# Patient Record
Sex: Female | Born: 1965
Health system: Southern US, Community
[De-identification: ages and names within clinical notes are randomized; demographics above are authoritative.]

## PROBLEM LIST (undated history)

## (undated) DIAGNOSIS — Z9889 Other specified postprocedural states: Secondary | ICD-10-CM

## (undated) DIAGNOSIS — K589 Irritable bowel syndrome without diarrhea: Secondary | ICD-10-CM

## (undated) DIAGNOSIS — E039 Hypothyroidism, unspecified: Secondary | ICD-10-CM

## (undated) DIAGNOSIS — K219 Gastro-esophageal reflux disease without esophagitis: Secondary | ICD-10-CM

## (undated) HISTORY — DX: Other specified postprocedural states: Z98.890

## (undated) HISTORY — PX: KNEE SURGERY: SHX244

## (undated) HISTORY — DX: Hypothyroidism, unspecified: E03.9

## (undated) HISTORY — PX: CHOLECYSTECTOMY: SHX55

## (undated) HISTORY — DX: Gastro-esophageal reflux disease without esophagitis: K21.9

---

## 1998-05-19 ENCOUNTER — Other Ambulatory Visit: Admission: RE | Admit: 1998-05-19 | Discharge: 1998-05-19 | Payer: Self-pay | Admitting: Obstetrics & Gynecology

## 1999-05-27 ENCOUNTER — Other Ambulatory Visit: Admission: RE | Admit: 1999-05-27 | Discharge: 1999-05-27 | Payer: Self-pay | Admitting: Obstetrics & Gynecology

## 2000-07-07 ENCOUNTER — Other Ambulatory Visit: Admission: RE | Admit: 2000-07-07 | Discharge: 2000-07-07 | Payer: Self-pay | Admitting: Obstetrics & Gynecology

## 2001-03-15 ENCOUNTER — Ambulatory Visit (HOSPITAL_COMMUNITY): Admission: RE | Admit: 2001-03-15 | Discharge: 2001-03-15 | Payer: Self-pay | Admitting: Obstetrics & Gynecology

## 2001-08-10 ENCOUNTER — Other Ambulatory Visit: Admission: RE | Admit: 2001-08-10 | Discharge: 2001-08-10 | Payer: Self-pay | Admitting: Obstetrics & Gynecology

## 2001-12-08 ENCOUNTER — Ambulatory Visit (HOSPITAL_COMMUNITY): Admission: RE | Admit: 2001-12-08 | Discharge: 2001-12-08 | Payer: Self-pay | Admitting: Family Medicine

## 2001-12-08 ENCOUNTER — Encounter: Payer: Self-pay | Admitting: Family Medicine

## 2001-12-25 ENCOUNTER — Observation Stay (HOSPITAL_COMMUNITY): Admission: RE | Admit: 2001-12-25 | Discharge: 2001-12-26 | Payer: Self-pay | Admitting: General Surgery

## 2001-12-25 ENCOUNTER — Encounter: Payer: Self-pay | Admitting: General Surgery

## 2001-12-25 ENCOUNTER — Encounter (INDEPENDENT_AMBULATORY_CARE_PROVIDER_SITE_OTHER): Payer: Self-pay | Admitting: Specialist

## 2002-05-21 ENCOUNTER — Other Ambulatory Visit: Admission: RE | Admit: 2002-05-21 | Discharge: 2002-05-21 | Payer: Self-pay | Admitting: Obstetrics & Gynecology

## 2002-09-12 ENCOUNTER — Ambulatory Visit (HOSPITAL_COMMUNITY): Admission: AD | Admit: 2002-09-12 | Discharge: 2002-09-12 | Payer: Self-pay | Admitting: Obstetrics & Gynecology

## 2002-12-05 ENCOUNTER — Inpatient Hospital Stay (HOSPITAL_COMMUNITY): Admission: AD | Admit: 2002-12-05 | Discharge: 2002-12-05 | Payer: Self-pay | Admitting: Obstetrics and Gynecology

## 2002-12-07 ENCOUNTER — Inpatient Hospital Stay (HOSPITAL_COMMUNITY): Admission: AD | Admit: 2002-12-07 | Discharge: 2002-12-12 | Payer: Self-pay | Admitting: Obstetrics and Gynecology

## 2002-12-09 ENCOUNTER — Encounter (INDEPENDENT_AMBULATORY_CARE_PROVIDER_SITE_OTHER): Payer: Self-pay | Admitting: *Deleted

## 2003-01-30 ENCOUNTER — Other Ambulatory Visit: Admission: RE | Admit: 2003-01-30 | Discharge: 2003-01-30 | Payer: Self-pay | Admitting: Obstetrics & Gynecology

## 2004-02-20 ENCOUNTER — Other Ambulatory Visit: Admission: RE | Admit: 2004-02-20 | Discharge: 2004-02-20 | Payer: Self-pay | Admitting: Obstetrics & Gynecology

## 2005-05-24 ENCOUNTER — Other Ambulatory Visit: Admission: RE | Admit: 2005-05-24 | Discharge: 2005-05-24 | Payer: Self-pay | Admitting: Obstetrics & Gynecology

## 2006-11-14 ENCOUNTER — Ambulatory Visit (HOSPITAL_COMMUNITY): Admission: RE | Admit: 2006-11-14 | Discharge: 2006-11-14 | Payer: Self-pay | Admitting: Family Medicine

## 2007-01-30 ENCOUNTER — Inpatient Hospital Stay (HOSPITAL_COMMUNITY): Admission: AD | Admit: 2007-01-30 | Discharge: 2007-01-30 | Payer: Self-pay | Admitting: Obstetrics and Gynecology

## 2007-11-16 ENCOUNTER — Encounter (HOSPITAL_COMMUNITY): Admission: RE | Admit: 2007-11-16 | Discharge: 2007-12-13 | Payer: Self-pay | Admitting: Family Medicine

## 2007-12-22 ENCOUNTER — Inpatient Hospital Stay (HOSPITAL_COMMUNITY): Admission: AD | Admit: 2007-12-22 | Discharge: 2007-12-22 | Payer: Self-pay | Admitting: Obstetrics and Gynecology

## 2008-04-16 ENCOUNTER — Inpatient Hospital Stay (HOSPITAL_COMMUNITY): Admission: RE | Admit: 2008-04-16 | Discharge: 2008-04-19 | Payer: Self-pay | Admitting: Obstetrics and Gynecology

## 2008-04-16 ENCOUNTER — Encounter (INDEPENDENT_AMBULATORY_CARE_PROVIDER_SITE_OTHER): Payer: Self-pay | Admitting: Obstetrics and Gynecology

## 2010-07-13 DIAGNOSIS — Z9889 Other specified postprocedural states: Secondary | ICD-10-CM

## 2010-07-13 HISTORY — DX: Other specified postprocedural states: Z98.890

## 2011-02-01 ENCOUNTER — Emergency Department (HOSPITAL_COMMUNITY)
Admission: EM | Admit: 2011-02-01 | Discharge: 2011-02-01 | Disposition: A | Discharge status: Home / Self Care | Payer: BC Managed Care – PPO | Attending: Emergency Medicine | Admitting: Emergency Medicine

## 2011-02-01 DIAGNOSIS — I10 Essential (primary) hypertension: Secondary | ICD-10-CM | POA: Insufficient documentation

## 2011-02-01 DIAGNOSIS — R112 Nausea with vomiting, unspecified: Secondary | ICD-10-CM | POA: Insufficient documentation

## 2011-02-01 DIAGNOSIS — Z79899 Other long term (current) drug therapy: Secondary | ICD-10-CM | POA: Insufficient documentation

## 2011-02-01 LAB — GLUCOSE, CAPILLARY: Glucose-Capillary: 104 mg/dL — ABNORMAL HIGH (ref 70–99)

## 2011-02-01 LAB — URINE MICROSCOPIC-ADD ON

## 2011-02-01 LAB — CBC
HCT: 35.2 % — ABNORMAL LOW (ref 36.0–46.0)
Hemoglobin: 11.7 g/dL — ABNORMAL LOW (ref 12.0–15.0)
MCH: 25.7 pg — ABNORMAL LOW (ref 26.0–34.0)
RBC: 4.55 MIL/uL (ref 3.87–5.11)

## 2011-02-01 LAB — URINALYSIS, ROUTINE W REFLEX MICROSCOPIC
Bilirubin Urine: NEGATIVE
Protein, ur: NEGATIVE mg/dL
Urine Glucose, Fasting: NEGATIVE mg/dL
Urobilinogen, UA: 0.2 mg/dL (ref 0.0–1.0)

## 2011-02-01 LAB — DIFFERENTIAL
Basophils Absolute: 0 10*3/uL (ref 0.0–0.1)
Lymphocytes Relative: 11 % — ABNORMAL LOW (ref 12–46)
Lymphs Abs: 0.8 10*3/uL (ref 0.7–4.0)
Monocytes Absolute: 0.7 10*3/uL (ref 0.1–1.0)
Neutro Abs: 5.8 10*3/uL (ref 1.7–7.7)

## 2011-02-01 LAB — COMPREHENSIVE METABOLIC PANEL
ALT: 25 U/L (ref 0–35)
AST: 23 U/L (ref 0–37)
CO2: 25 mEq/L (ref 19–32)
Chloride: 103 mEq/L (ref 96–112)
GFR calc Af Amer: >60 mL/min (ref >60)
GFR calc non Af Amer: >60 mL/min (ref >60)
Glucose, Bld: 93 mg/dL (ref 70–99)
Sodium: 138 mEq/L (ref 135–145)
Total Bilirubin: 1.3 mg/dL — ABNORMAL HIGH (ref 0.3–1.2)

## 2011-04-08 ENCOUNTER — Ambulatory Visit: Payer: BC Managed Care – PPO | Admitting: Urgent Care

## 2011-04-08 ENCOUNTER — Encounter: Payer: Self-pay | Admitting: Gastroenterology

## 2011-04-08 ENCOUNTER — Ambulatory Visit (INDEPENDENT_AMBULATORY_CARE_PROVIDER_SITE_OTHER): Payer: BC Managed Care – PPO | Admitting: Gastroenterology

## 2011-04-08 VITALS — BP 153/89 | HR 84 | Temp 98.1°F | Ht 65.0 in | Wt 200.0 lb

## 2011-04-08 DIAGNOSIS — R1013 Epigastric pain: Secondary | ICD-10-CM | POA: Insufficient documentation

## 2011-04-08 DIAGNOSIS — R110 Nausea: Secondary | ICD-10-CM | POA: Insufficient documentation

## 2011-04-08 MED ORDER — DEXLANSOPRAZOLE 60 MG PO CPDR: 60.0000 mg | DELAYED_RELEASE_CAPSULE | Freq: Every day | ORAL | Status: DC

## 2011-04-08 NOTE — Progress Notes (Signed)
Referring Provider: Harlow Asa, MD Primary Care Physician:  Harlow Asa, MD Primary Gastroenterologist:  Dr. Jena Gauss  Chief Complaint  Patient presents with  . Gastrophageal Reflux    HPI:  [name] is a pleasant 45 year old female who presents today at the request of Dr. Gerda Diss secondary to GERD, nausea, and epigastric pain. Reports onset of nausea in February, was given Omeprazole. Has constant burping, pressure, and intermittent epigastric pain, specifically after eating. Described as "sore". Taking Tums in addition to Omeprazole BID. Has lost approximately 10 lbs, normal appetite. Trying to watch what she eats. Was taking Advil approximately once a week prior to February, secondary to back pain. No BC powders or Goodys. Has stopped NSAIDs since pain/nausea began in February.   Reports heme + in August, underwent colonoscopy in [place]. No reports available currently.    Past Surgical History  Procedure Date  . Cesarean section     X 2  . Cholecystectomy   . Knee surgery     Current Outpatient Prescriptions  Medication Sig Dispense Refill  . levothyroxine (SYNTHROID, LEVOTHROID) 25 MCG tablet Take 1 tablet by mouth daily.      Marland Kitchen omeprazole (PRILOSEC) 20 MG capsule Take 1 tablet by mouth Twice daily.      . sucralfate (CARAFATE) 1 G tablet Take 1 tablet by mouth 4 (four) times daily -  before meals and at bedtime.        Allergies as of 04/08/2011 - Review Complete 04/08/2011  Allergen Reaction Noted  . Erythromycin Hives 04/08/2011    Family History  Problem Relation Age of Onset  . Colon cancer Neg Hx     History   Social History  . Marital Status: Married    Spouse Name: N/A    Number of Children: 2  . Years of Education: N/A   Occupational History  . 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 Smoking status: Never Smoker   . Smokeless tobacco: Never Used  . Alcohol Use: No  . Drug Use: No  . Sexually Active: Yes -- Female partner(s)    Birth Control/ Protection: None     spouse    Review of Systems: Gen: Denies any fever, chills, sweats, anorexia, fatigue, weakness, malaise CV: Denies chest pain, angina, palpitations, syncope, orthopnea, PND, peripheral edema, and claudication. Resp: Denies dyspnea at rest, dyspnea with exercise, cough, sputum, wheezing, coughing up blood, and pleurisy. GI: Denies vomiting blood, jaundice, and fecal incontinence.   Denies dysphagia or odynophagia. GU : Denies urinary burning, blood in urine, urinary frequency, urinary hesitancy, nocturnal urination, and urinary incontinence. MS: Denies joint pain, limitation of movement, and swelling, stiffness, low back pain, extremity pain. Denies muscle weakness, cramps, atrophy.  Derm: Denies rash, itching, dry skin, hives, moles, warts, or unhealing ulcers.  Psych: Denies depression, anxiety, memory loss, suicidal ideation, hallucinations, paranoia, and confusion. Heme: Denies bruising, bleeding, and enlarged lymph nodes.  Physical Exam: BP 153/89  Pulse 84  Temp 98.1 F (36.7 C)  Ht 5\' 5"  (1.651 m)  Wt 200 lb (90.719 kg)  BMI 33.28 kg/m2  LMP 03/18/2011 General:   Alert,  Well-developed, well-nourished, pleasant and cooperative in NAD Head:  Normocephalic and atraumatic. Eyes:  Sclera clear, no icterus.   Conjunctiva pink. Ears:  Normal auditory acuity. Nose:  No deformity, discharge,  or lesions. Mouth:  No deformity or lesions, dentition normal. Neck:  Supple; no masses or thyromegaly. Lungs:  Clear throughout to  auscultation.   No wheezes, crackles, or rhonchi. No acute distress. Heart:  Regular rate and rhythm; no murmurs, clicks, rubs,  or gallops. Abdomen:  Soft, nontender and nondistended. No masses, hepatosplenomegaly or hernias noted. Normal bowel sounds, without guarding, and without rebound.   Msk:  Symmetrical without gross deformities. Normal posture. Extremities:  Without clubbing or edema. Neurologic:  Alert and  oriented  x4;  grossly normal neurologically. Skin:  Intact without significant lesions or rashes. Cervical Nodes:  No significant cervical adenopathy. Psych:  Alert and cooperative. Normal mood and affect.

## 2011-04-08 NOTE — Assessment & Plan Note (Addendum)
45 year old pleasant Caucasian female with hx of epigastric pain and nausea since February, worsened with food, in the setting of prior NSAID use and despite BID omeprazole and prn Tums. Reportedly heme + in August, underwent colonoscopty that was reportedly normal. Differentials include NSAID-induced gastritis/PUD. Will switch PPIs, stop carafate, and proceed with upper endoscopy to assess further.  Stop omeprazole and carafate. Dexilant 60 mg daily, # 15 samples provided, rx provided EGD with Dr. Jena Gauss in the near future; the R/B/A have been discussed with pt in detail. She desires to proceed. Further recommendations to follow.

## 2011-04-08 NOTE — Assessment & Plan Note (Signed)
Likely r/t epigastric pain. See plan under epigastric pain.

## 2011-04-08 NOTE — Patient Instructions (Signed)
We have set you up for an upper endoscopy.  Continue to avoid Aleve, Ibuprofen, Aspirin, BC powders, and Goody powders.  Stop Omeprazole and Carafate. Begin Dexilant 60 mg by mouth daily. #15 samples provided and savings card. Prescription provided as well; make sure you activate savings card prior to filling prescription.

## 2011-04-09 NOTE — Progress Notes (Signed)
Cc to PCP 

## 2011-04-19 ENCOUNTER — Encounter: Payer: BC Managed Care – PPO | Admitting: Internal Medicine

## 2011-04-27 ENCOUNTER — Ambulatory Visit (HOSPITAL_COMMUNITY)
Admission: RE | Admit: 2011-04-27 | Discharge: 2011-04-27 | Disposition: A | Discharge status: Home / Self Care | Payer: BC Managed Care – PPO | Source: Ambulatory Visit | Attending: Internal Medicine | Admitting: Internal Medicine

## 2011-04-27 ENCOUNTER — Encounter: Payer: BC Managed Care – PPO | Admitting: Internal Medicine

## 2011-04-27 DIAGNOSIS — R1013 Epigastric pain: Secondary | ICD-10-CM

## 2011-04-27 DIAGNOSIS — Z6833 Body mass index (BMI) 33.0-33.9, adult: Secondary | ICD-10-CM | POA: Insufficient documentation

## 2011-04-27 DIAGNOSIS — R112 Nausea with vomiting, unspecified: Secondary | ICD-10-CM | POA: Insufficient documentation

## 2011-04-27 DIAGNOSIS — K219 Gastro-esophageal reflux disease without esophagitis: Secondary | ICD-10-CM

## 2011-04-27 NOTE — Op Note (Signed)
NAMEJAVON, [name]                ACCOUNT NO.:  [id number]   MEDICAL RECORD NO.:  [id number]          PATIENT TYPE:  INP   LOCATION:  9101                          FACILITY:  [facility]   PHYSICIAN:  Guy Sandifer. Henderson Cloud, M.D. DATE OF BIRTH:  Oct 17, 1966   DATE OF PROCEDURE:  04/16/2008  DATE OF DISCHARGE:                               OPERATIVE REPORT   PREOPERATIVE DIAGNOSES:  1. Intrauterine pregnancy at 39-0/7 weeks' estimated gestational age.  2. Previous cesarean section, desires repeat.  3. Desires permanent sterilization.   POSTOPERATIVE DIAGNOSES:  1. Intrauterine pregnancy at 39-0/7 weeks' estimated gestational age.  2. Previous cesarean section, desires repeat.  3. Desires permanent sterilization.   PROCEDURE:  Low-transverse cesarean section with bilateral tubal  ligation.   SURGEON:  Guy Sandifer. Henderson Cloud, MD   ANESTHESIA:  Spinal.   ANESTHESIOLOGIST:  Shayne Alken, M.D.   SPECIMENS:  Placenta to pathology.   FINDINGS:  Viable female infant, Apgars of 8 at 1 and 9 at 5 minutes  respectively.  Birth weight 9 pounds 5 ounces.  Arterial cord pH 7.06.   ESTIMATED BLOOD LOSS:  800 mL.   INDICATIONS AND CONSENTS:  This patient is a 45 year old married white  female, G4, P93, with an EDC of Apr 23, 2008.  Prenatal care has been  complicated by a history of previous cesarean section and  hypothyroidism, on replacement.  She presents for repeat cesarean  section and bilateral tubal ligation.  Potential risks and complications  were discussed preoperatively.  Permanency of tubal ligation, alternate  methods to contraception, failure rate, and increased ectopic risk have  also been reviewed preoperatively.  All questions were answered and  consent was signed on the chart.   DESCRIPTION OF PROCEDURE:  The patient was taken to operating room where  she was identified, spinal anesthetic was placed, and she was placed in  the dorsal supine position with 15 degree left lateral wedge.   She was  then prepped.  Foley catheter was placed and bladders drained and she  was draped in a sterile fashion.  Skin incision through a Pfannenstiel  incision and old scars taken out on the way.  Dissection was carried out  in layers to the peritoneum, which was incised and extended superiorly  and inferiorly.  Vesicouterine peritoneum was taken down  cephalolaterally.  Bladder flap was developed and bladder blade was  placed.  Uterus was incised in a low-transverse manner and the uterine  cavity was entered bluntly with a Kelly clamp.  The uterine was incision  was extended cephalolaterally with fingers.  Artificial rupture of  membranes with clear fluid was carried out.  The rectus muscles would  not allow easy delivery of the vertex.  Therefore, a mushroom vacuum  extractor was placed on the vertex and the vertex was elevated without  difficulty.  Oro and nasopharynx were suctioned.  The remainder of the  baby was delivered and cry and tone was noted.  Cord was clamped and cut  and the baby was handed to waiting pediatrics team.  Placenta was  manually delivered and sent to  pathology.  The uterus was clean.  The  uterus was closed in two running locking imbricating layers of 0-  Monocryl suture, which achieved good hemostasis.  Ovaries were normal  bilaterally.  Left fallopian tube was identified from cornua to fimbria,  grasped in its midportion with a Babcock clamp, and knuckle of tube was  doubly ligated with 2 free ties of 0 plain sutures.  Intervening knuckle  of tube was then sharply resected.  Cautery was used to assure complete  hemostasis.  Similar procedure was carried out on the right side.  Anterior peritoneum was then closed in running fashion with 0-Monocryl  suture, which was also used to reapproximate the pyramidalis muscle in  midline.  Anterior rectus fascia was closed in running fashion with 0-  PDS suture and the skin was closed with clips.  All sponge,  instrument,  and needle counts were correct.  The patient was transferred to the  recovery room in stable condition.      Guy Sandifer Henderson Cloud, M.D.  Electronically Signed     JET/MEDQ  D:  04/16/2008  T:  04/17/2008  Job:  [id number]

## 2011-04-30 NOTE — Op Note (Signed)
[facility]  Patient:    [name], [name] Visit Number: [id number] MRN: [id number]          Service Type: SUR Location: 4W [id number] [id number] Attending Physician:  Delsa Bern Proc. Date: 12/25/01 Admit Date:  12/25/2001                             Operative Report  PREOPERATIVE DIAGNOSIS:  Cholelithiasis.  POSTOPERATIVE DIAGNOSIS:  Cholelithiasis.  PROCEDURE PERFORMED:  Laparoscopic cholecystectomy with intraoperative cholangiogram.  SURGEON:  Sharlet Salina T. Hoxworth, M.D.  ASSISTANT:  Donnie Coffin. Samuella Cota, M.D.  ANESTHESIA:  General.  INDICATION:  Reid Nawrot is a 45 year old white female who recently presentated with acute severe epigastric and right upper quadrant abdominal pain.  This resolved over about 48 hours and at that time an ultrasound was obtained showing small stones and sludge in the gallbladder.  The common bile duct was normal size, but LFTs were moderately elevated.  She has continued to have mild discomfort.  The LFTs have now returned normal.  Laparoscopic cholecystectomy with cholangiogram has been recommended and accepted.  The nature of the procedure, its indications and risks of bleeding and infection, and injury was discussed and understood.  She is now brought to the operating room for this procedure.  DESCRIPTION OF PROCEDURE:  The patient was brought to the operating room and placed in the supine position on the operating table and general endotracheal anesthesia was induced.  IV antibiotics were given.  The abdomen was sterilely prepped and draped.  Local anesthesia was used to infiltrate the trocar sites. A previous hypertrophic scar at the umbilicus from laparoscopy was excised and dissection carried down to the midline fascia which was elevated between two Kochers, sharply incised from 1 cm, and the peritoneum entered under direct. A mattress suture of 0 Vicryl was placed and the Hasson trocar was inserted and  pneumoperitoneum established.  Under direct vision, a 10 mm trocar was placed in the subxiphoid area and two 5 mm trocars in the right subcostal margin.  The gallbladder was visualized and was not acutely inflamed.  The fundus was grasped and elevated up off the liver and infundibulum retracted inferolaterally.  Fibrofatty tissue was taken off the neck of the gallbladder and the distal gallbladder was thoroughly dissected as was Calots triangle. The cystic duct gallbladder junction was dissected 360 degrees and the cystic duct dissected free of about 1 cm.  The cystic duct was clipped at the gallbladder junction.  The anterior branch of the cystic artery was seen coursing upon the gallbladder wall and it was divided between two proximal and one distal clip.  Intraoperative cholangiograms were then obtained through the cystic duct which showed good filling of a normal size common bile duct and intrahepatic ducts with free flow into the duodenum and no filling defects. Following this, the cholangiocath was removed and the cystic duct was doubly clipped proximally and divided.  The gallbladder was then dissected free from its bed using hook cautery.  A small posterior cystic artery branch was clipped from the gallbladder bed.  The gallbladder was removed intact through the umbilicus.  The operative site was inspected for hemostasis which was complete.  The trocars were removed under direct vision.  All CO2 was evacuated from the peritoneal cavity.  The mattress suture was secured at the umbilicus.  Skin incisions were closed with interrupted subcuticular 4-0 Monocryl and Steri-Strips.  Sponge and instrument counts were  correct.  Fluff dressings were applied and the patient taken to the recovery room in good condition. Attending Physician:  Delsa Bern DD:  12/25/01 TD:  12/25/01 Job: [id number]

## 2011-04-30 NOTE — Discharge Summary (Signed)
[name], [name]                ACCOUNT NO.:  [id number]   MEDICAL RECORD NO.:  [id number]          PATIENT TYPE:  INP   LOCATION:  9101                          [facility]ILITY:  [facility]   PHYSICIAN:  Michelle L. Grewal, M.D.DATE OF BIRTH:  [birth year]   DATE OF ADMISSION:  04/16/2008  DATE OF DISCHARGE:  04/19/2008                               DISCHARGE SUMMARY   ADMITTING DIAGNOSES:  1. Intrauterine pregnancy at 80 and 0/7th weeks' estimated gestational      age.  2. Previous cesarean section, desires repeat.  3. Multiparity, desires permanent sterilization.   DISCHARGE DIAGNOSES:  1. Status post low transverse cesarean section.  2. Viable female infant.  3. Permanent sterilization.   PROCEDURE:  1. Repeat low transverse cesarean section.  2. Bilateral tubal ligation.   REASON FOR ADMISSION:  Please see written H and P.   HOSPITAL COURSE:  The patient is a 45 year old white married female  gravida 4, para 1 who presents to [facility] for  scheduled cesarean section.  Due to multiparity, the patient also  requested permanent sterilization.  On the morning of admission, the  patient was taken to the operating room where spinal anesthesia was  administered without difficulty.  A low transverse incision was made  with delivery of a viable female infant weighing 9 pounds 5 ounces with  Apgars of 8 at 1 minute and 9 at 5 minutes.  Arterial cord pH was 7.06.  The patient tolerated the procedure well and was taken to the recovery  room in stable condition.  On postoperative day #1, the patient denied  headache, blurred vision, or right upper quadrant pain.  Vital signs  were stable.  She is afebrile.  Abdomen soft.  Blood pressure was noted  to be 142/87 to 135/82.  Abdominal dressing was clean, dry, and intact.  Foley had been discontinued, voiding well.  Laboratory findings revealed  hemoglobin of 9.4, platelet count 157,000, and WBC count of 9.9.  AST  was 26, ALT was  14.  Uric acid was pending.  By postoperative day #2,  the patient continued to deny headache, blurred vision, or right upper  quadrant pian.  She did complain of some lower extremity edema, left  greater than right.  Vital signs were stable with blood pressure 140/83  to 144/88.  Abdomen soft.  Good return of bowel function.  Fundus firm  and nontender.  Incision was clean, dry, and intact.  Small lower  extremity edema was noted.  Deep tendon reflexes 1 to 2+, no clonus, no  erythema, and negative Homan's.  PIH panel was drawn, and the patient  was offered hydrochlorothiazide, however, she declined. On postoperative  day #3, the patient denied headache, blurred vision, or right upper  quadrant pain.  Vital signs remained stable.  Blood pressure was 138/88  to 159/94.  Deep tendon reflexes 2+.  Pedal edema was noted.  Abdomen  soft.  Fundus firm and nontender.  Incision was clean, dry, and intact.  Staples were removed.  Laboratory findings showed hemoglobin of 8.8,  platelet count of 189,000, and  WBC count of 8.6.  AST was 35, ALT was  19, and alkaline phosphatase was 120.  Discharge instructions were  reviewed, and the patient was later discharged home.   CONDITION ON DISCHARGE:  Stable.   DIET:  Regular as tolerated.   ACTIVITY:  No heavy lifting, no driving x2 weeks.  No vaginal entry.   FOLLOWUP:  The patient is to follow up in the office in 3-4 days for  blood pressure check and incision check.  She is to call for temperature  greater than 100 degrees, persistent nausea, vomiting, heavy vaginal  bleeding, and/or redness or drainage in the incisional site.   DISCHARGE MEDICATIONS:  1. Percocet 5/325, #30, one p.o. q.4-6 h. p.r.n.  2. Motrin 600 mg every 6 hours p.r.n.  3. Levothyroxine one p.o. daily.  4. Prenatal vitamins one p.o. daily.      [name], N.P.      [name]. [name], M.D.  Electronically Signed    CC/MEDQ  D:  05/03/2008  T:  05/04/2008  Job:   [id number]

## 2011-04-30 NOTE — Discharge Summary (Signed)
NAME:  [name], [name]                          ACCOUNT NO.:  [id number]   MEDICAL RECORD NO.:  [id number]                   PATIENT TYPE:  INP   LOCATION:  9101                                 [facility]ILITY:  [facility]   PHYSICIAN:  Freddy Finner, M.D.                DATE OF BIRTH:  September 23, 1966   DATE OF ADMISSION:  12/07/2002  DATE OF DISCHARGE:  12/12/2002                                 DISCHARGE SUMMARY   ADMITTING DIAGNOSES:  1. Intrauterine pregnancy at 43 and 3/7 weeks estimated gestational age.  2. Pregnancy induced hypertension.   DISCHARGE DIAGNOSES:  1. Status post low transverse cesarean section secondary to nonreassuring     nonstress test.  2. Viable female infant.   PROCEDURE:  Primary low transverse cesarean section.   REASON FOR ADMISSION:  Please see written H&P.   HOSPITAL COURSE:  The patient is a 45 year old gravida 2, para 0 at 58 and  3/7 weeks estimated gestational age who was admitted to [facility] for a two-stage induction of labor due to pregnancy induced  hypertension.  Laboratories were within normal limits and blood pressure was  stable.  Cytotec was administered on the evening of admission with Pitocin  started the following morning.  This failed to achieve active labor.  Pitocin was discontinued.  Cytotec was reapplied that evening and Pitocin  was started on the following a.m.  The patient did progress to 4 cm dilated,  100% effaced with vertex noted to be a -3 station with some molding.  Spontaneous rupture of membranes did occur which revealed meconium stained  fluid.  Intrauterine pressure catheter and fetal scalp electrode were  placed.  Amnioinfusion was started secondary to meconium stained fluid.  Fetal heart tones were noted to have decreased beat to beat variability.  There was also noted some late decelerations on the fetal monitor strip.  Cervix remained unchanged at 4 cm dilated, now 90% effaced with vertex that  continued  to be at a -3 station.  Due to lack of reassurance with fetal  heart tone tracings, decision was made to proceed with the cesarean  delivery.  The patient was taken to the operating room where epidural  anesthesia was augmented to a surgical level.  A low transverse incision was  made with the delivery of a viable female infant weighing 7 pounds 15 ounces  with Apgars of 8 at one minute, 9 at five minutes.  Arterial cord pH was  7.24.  The patient tolerated procedure well and was taken to the recovery  room in stable condition.   On postoperative day one patient had good return of bowel function.  Abdomen  was soft.  Fundus was firm and nontender.  Abdominal dressing was clean,  dry, and intact.  Laboratories revealed hemoglobin of 11.0, platelet count  of 196,000, WBC count of 11.5.  On postoperative day two patient was  ambulating well.  Blood pressure was stable.  Abdominal dressing was removed  revealing an incision that was clean, dry, and intact.  The patient was  tolerating a regular diet without complaints of nausea and vomiting.  On  postoperative day three patient was doing well.  Incision was clean, dry,  and intact.  Staples were removed and patient was discharged home.   CONDITION ON DISCHARGE:  Good.   DIET:  Regular as tolerated.   ACTIVITY:  No heavy lifting.  No driving x2 weeks.  No vaginal entry.   FOLLOW UP:  The patient is to follow up in the office in one to two weeks  for an incision check.  She is to call for temperature greater than 100  degrees, persistent nausea and vomiting, heavy vaginal bleeding, and/or  redness or drainage from the incisional site.   DISCHARGE MEDICATIONS:  1. Percocet 5/325 numbers 30 one p.o. q.4-6h. p.r.n. pain.  2. Motrin 600 mg q.6h. p.r.n.  3. Prenatal vitamins one p.o. daily.  4. Colace one p.o. daily p.r.n.     [name], N.P.                        Freddy Finner, M.D.    CC/MEDQ  D:  01/15/2003  T:  01/15/2003  Job:   [id number]

## 2011-04-30 NOTE — Op Note (Signed)
[facility]  Patient:    [name], [name]                         MRN: [id number] Proc. Date: 03/15/01 Attending:  Freddy Finner, M.D.                           Operative Report  PREOPERATIVE DIAGNOSIS:       Chronic left pelvic pain, suspected                               pelvic endometriosis.  POSTOPERATIVE DIAGNOSES:      Pelvic endometriosis.                               Adhesions of sigmoid colon to left                               lateral pelvic peritoneum.                               Endometriotic lesions of left uterosacral                               and cul-de-sac; uterosacral lesions of                               abdominal peritoneum near adhesions to                               appendix and to ascending colon and cecum.  OPERATION:                    Diagnostic laparoscopy, bipolar ablation                               of all visible evidence of pelvic endometriosis,                               lysis of perisigmoid and periappendiceal                               adhesions with fulguration along the line of                               insertion of the adhesions in the left pelvis                               due to suspicion of endometriosis as the                               etiology of these adhesions.  SURGEON:  Freddy Finner, M.D.  INTRAOPERATIVE COMPLICATIONS: None.  ANESTHESIA:                   General endotracheal anesthesia.  ESTIMATED BLOOD LOSS:         Less than 10 cc.  INDICATIONS:                  The patient is a 45 year old gravida 1, para 0 who has had several months history of chronic left sided pelvic pain. Due to the persistence of her symptoms and the discomfort associated with the anxiety related to this, she has elected to proceed with diagnostic laparoscopy. She is admitted at this time for that purpose.  DESCRIPTION OF PROCEDURE:     She was admitted on the morning  of surgery, brought to the operating room and there placed under adequate general endotracheal anesthesia, and placed in the dorsal lithotomy position using the Allen stirrups system. Betadine prep of the abdomen, perineum and vagina was carried out in the usual fashion. The bladder was evacuated with a Robinson catheter. A Hulka tenaculum was attached to the cervix without difficulty. Sterile drapes were applied.  Two small incisions were made in the abdomen, one at the umbilicus and one just above the symphysis. An 11 mm disposable trocar was introduced through the upper incision; adequate placement was confirmed by visual microscopic exam. Pneumoperitoneum was then allowed to accumulate using carbon dioxide gas. A second 5 mm trocar was placed through the lower incision. A blunt probe was initially used and then the Nezhat irrigation probe through the lower incision. Using the bipolar Kleppinger forceps and reticulated scissors, all visible evidence of endometriosis in the pelvis and lower abdomen was fulgurated and the adhesions around the appendix which tented the appendix to the lateral wall on the right and the ones around the sigmoid were divided and fulgurated. Complete hemostasis was noted on careful inspection after the dissection. Irrigating solution was used during the procedure which was Ringers lactate; this was all aspirated from the abdomen. The gas was allowed to escape from the abdomen. Plain Marcaine 0.50% was injected into the two incision sites for postoperative analgesia. The incision sites were closed with interrupted subcuticular sutures of 3-0 Dexon and Steri-Strips were applied to the lower incision. The patient tolerated the procedure well and was taken to the recovery room in good condition.  DISPOSITION:                  She will be given routine outpatient surgical instructions. She was given Vicodin to be taken as needed for postoperative pain. She is to  follow-up in the office in approximately ten days. DD:  03/15/01 TD:  03/15/01 Job: [id number]

## 2011-04-30 NOTE — Op Note (Signed)
NAME:  MECHELE, KITTLESON                          ACCOUNT NO.:  [id number]   MEDICAL RECORD NO.:  [id number]                   PATIENT TYPE:  INP   LOCATION:  9101                                 FACILITY:  [facility]   PHYSICIAN:  Guy Sandifer. Arleta Creek, M.D.           DATE OF BIRTH:  [birth year]   DATE OF PROCEDURE:  12/09/2002  DATE OF DISCHARGE:                                 OPERATIVE REPORT   PREOPERATIVE DIAGNOSES:  1. Intrauterine pregnancy at 39-3/7 weeks' estimated gestational age.  2. Pregnancy induced hypertension.  3. Nonreassuring fetal heart tracing.   POSTOPERATIVE DIAGNOSES:  1. Intrauterine pregnancy at 39-3/7 weeks' estimated gestational age.  2. Pregnancy induced hypertension.  3. Nonreassuring fetal heart tracing.   PROCEDURE:  Low transverse cesarean section.   SURGEON:  Guy Sandifer. Henderson Cloud, M.D.   ANESTHESIA:  Epidural by Raul Del, M.D.   ESTIMATED BLOOD LOSS:  800 cc.   SPECIMENS:  Placenta.   FINDINGS:  Viable female infant, Apgars of 8 and 9 at one and five minutes,  respectively.  Arterial cord pH 7.24.  Birth weight pending.   INDICATIONS AND CONSENT:  This patient is a 45 year old married white  female, G2, P0, with an EDC of December 13, 2002.  She was admitted to [facility] on December 07, 2002, for a two stage induction for pregnancy  induced hypertension.  Laboratories remained normal, and blood pressures  remained stable.  She underwent Cytotec followed by Pitocin on December 08, 2002.  This failed to achieve active phase of labor.  The Pitocin was  discontinued.  Cytotec was repeated on the evening of December 08, 2002, and  Pitocin was resumed on December 09, 2002.  The patient progressed to 4 cm  dilation, complete effacement, -3 station with some molding.  Spontaneous  rupture of membranes revealed meconium staining.  At approximately 1 p.m.  intrauterine pressure catheter and fetal scalp electrode were placed.  The  patient was  placed on oxygen for decreased irritability and amnioinfusion  was started secondary to meconium staining.  Following this, the fetal heart  tracing remained flat with very little beat-to-beat variability.  There were  also some late declarations noted on the tracing.  The cervix remained  unchanged at 4 cm dilation, 90% effacement and -3 station with molding.  Due  to the lack of reassurance in the fetal heart tracing and anticipated long  length of the labor as well as the high station cesarean section is  recommended.  The potential risks and complications were discussed with the  patient and her husband including, but not limited to, infection, bowel,  bladder or ureteral damage, bleeding requiring transfusion of blood  products, possible transfusion reaction, HIV and hepatitis acquisition, DVT,  PE, pneumonia.  All questions were answered, and consent is signed on the  chart.   DESCRIPTION OF PROCEDURE:  The patient was taken to the  operating room where  epidural anesthetic was augmented to a surgical level.  She was then placed  in the dorsal supine position with a 15 degree left lateral wedge.  Foley  catheter was already in place.  She was prepped and draped in a sterile  fashion.  After testing for adequate epidural anesthesia, the skin was  entered through a Pfannenstiel incision and dissection was carried out in  layers to the peritoneum.  The peritoneum was sharply incised and extended  superiorly and inferiorly.  The vesicouterine peritoneum was taken down  cepaholaterally.  The bladder flap was develop,  and the bladder blade was  placed.  The uterus was incised in a low transverse  manner, and the uterine  cavity was entered bluntly with a hemostat.  The uterine incision is  extended cephalolaterally with the fingers.  The vertex was noted to be out  of the pelvis in the occipitoposterior presentation.  The vertex was  delivered, and the oropharynx and nasopharynx were  thoroughly suctioned with  DeLee suction which was productive of mostly clear fluid.  The remainder of  the infant was delivered.  Good cry and tone is noted.  The cord is clamped  and cut, and the infant is handed to the awaiting pediatrics team.  The  placenta was manually delivered and sent to pathology.  The uterine cavity  is normal in contour.  The uterine incision is closed in  a running locking  layer of 0 Monocryl suture and a second imbricating layer of 0 Monocryl.  Additional figure-of-eight is placed on the right side of the incision to  obtain good hemostasis.  Tubes and ovaries palpate normally.  Copious  irrigation is carried out, and all returns are clear.  The anterior  peritoneum is closed in a running fashion with 0 Monocryl suture which is  also used to reapproximate the pyramidalis  muscle in the midline.  The  anterior rectus fascia was closed in a running fashion with 0 PDS suture,  and the skin was closed with clips.  All sponge, instrument and needle  counts were correct, and the patient was transferred to the recovery room in  stable condition.   The pediatrician noticed a 3-5 mm very superficial laceration on the baby's  lip possibly secondary to DeLee suction, possibly secondary to the baby  scratching itself.  While the patient was looking at her baby during the C-  section the baby does scratch itself in the second location with its  fingernails on its face.  This is discussed with the patient and her  husband.                                               Guy Sandifer Arleta Creek, M.D.    JET/MEDQ  D:  12/09/2002  T:  12/09/2002  Job:  [id number]

## 2011-05-03 NOTE — Op Note (Signed)
  NAMEFELICIDAD, SUGARMAN                ACCOUNT NO.:  [id number]  MEDICAL RECORD NO.:  [id number]           PATIENT TYPE:  O  LOCATION:  DAYP                          FACILITY:  [facility]  PHYSICIAN:  R. Roetta Sessions, M.D. DATE OF BIRTH:  [birth year]  DATE OF PROCEDURE:  04/27/2011 DATE OF DISCHARGE:                              OPERATIVE REPORT   Diagnostic EGD.  INDICATIONS FOR PROCEDURE:  A 45 year old lady with symptoms consistent with reflux along with nausea, vomiting and epigastric pain.  No significant improvement with omeprazole and Carafate.  Seen in the office on April 08, 2011, was started on Dexilant in lieu of Carafate and omeprazole.  This has been associated with marked improvement in her symptoms, has not had any melena, weight loss, odynophagia, or dysphagia.  EGD is now being done.  Risks, benefits, limitations, alternatives, and imponderables have been discussed, questions answered. Please see the documentation for medical record.  PROCEDURE NOTE:  O2 saturation, blood pressure, pulse, and respirations were monitored throughout the entire procedure.  CONSCIOUS SEDATION:  Versed 4 mg IV, Demerol 100 mg IV in divided doses.  INSTRUMENT:  Pentax video chip system.  Cetacaine spray for topical pharyngeal anesthesia.  FINDINGS:  Examination of the tubular esophagus revealed entirely normal mucosa.  EGD junction easily traversed.  Stomach:  Gastric cavity was emptied and insufflated well with air.  Thorough examination of the gastric mucosa including retroflexed view of the proximal stomach and esophagogastric junction demonstrated no abnormalities.  Pylorus was patent, easily traversed.  Examination of the bulb and second portion revealed no abnormalities.  Therapeutic/diagnostic maneuvers performed: None.  The patient tolerated the procedure well, reactive to endoscopy.  IMPRESSION:  Normal esophagus, stomach, D1 and D2.  Suspect the patient's symptoms have been  largely related to reflux.  Her gallbladder was removed in distant past.  I suspect PPI failure with omeprazole.  BMI 33.28.  RECOMMENDATIONS: 1. Multiple approach to gastroesophageal reflux disease.  Encouraged     weight loss.  Literature on     gastroesophageal reflux disease provided to Ms. Talton. 2. Continue Dexilant 60 mg orally daily, prescription previously     provided. 3. Follow up with [place] in 1 year and p.r.n.     R. Roetta Sessions, M.D.     RMR/MEDQ  D:  04/27/2011  T:  04/27/2011  Job:  [id number]  cc:   Donna Bernard, M.D. Fax: [phone]  Electronically Signed by Lorrin Goodell M.D. on 05/03/2011 10:04:42 AM

## 2011-09-01 LAB — CBC
MCHC: 35.1
Platelets: 278
RDW: 13.8

## 2011-09-08 LAB — CBC
HCT: 31.5 — ABNORMAL LOW
Hemoglobin: 10.6 — ABNORMAL LOW
MCHC: 34.1
MCV: 79.6
Platelets: 157
Platelets: 164
Platelets: 189
RBC: 3.95
RBC: 4.52
WBC: 11.4 — ABNORMAL HIGH
WBC: 14.5 — ABNORMAL HIGH
WBC: 8.6
WBC: 9.9

## 2011-09-08 LAB — COMPREHENSIVE METABOLIC PANEL
AST: 35
Albumin: 2.1 — ABNORMAL LOW
Albumin: 2.2 — ABNORMAL LOW
Alkaline Phosphatase: 120 — ABNORMAL HIGH
Alkaline Phosphatase: 161 — ABNORMAL HIGH
BUN: 10
Chloride: 103
Chloride: 105
GFR calc Af Amer: >60
Potassium: 3.7
Potassium: 4.2
Sodium: 136
Total Bilirubin: 0.7
Total Bilirubin: 1.1

## 2011-09-08 LAB — DIFFERENTIAL
Basophils Absolute: 0
Basophils Absolute: 0
Basophils Relative: 0
Basophils Relative: 0
Eosinophils Relative: 0
Eosinophils Relative: 2
Monocytes Absolute: 0.6
Monocytes Absolute: 0.7
Neutro Abs: 12.5 — ABNORMAL HIGH

## 2011-09-08 LAB — RPR: RPR Ser Ql: NONREACTIVE

## 2011-10-11 ENCOUNTER — Other Ambulatory Visit: Payer: Self-pay

## 2011-10-11 MED ORDER — DEXLANSOPRAZOLE 60 MG PO CPDR: 60.0000 mg | DELAYED_RELEASE_CAPSULE | Freq: Every day | ORAL | Status: DC

## 2011-10-12 ENCOUNTER — Other Ambulatory Visit: Payer: Self-pay

## 2011-10-12 MED ORDER — DEXLANSOPRAZOLE 60 MG PO CPDR: 60.0000 mg | DELAYED_RELEASE_CAPSULE | Freq: Every day | ORAL | Status: DC

## 2013-02-28 ENCOUNTER — Encounter: Payer: Self-pay | Admitting: *Deleted

## 2013-03-01 ENCOUNTER — Ambulatory Visit: Payer: 59 | Admitting: Nurse Practitioner

## 2013-03-08 ENCOUNTER — Telehealth: Payer: Self-pay | Admitting: Family Medicine

## 2013-03-08 NOTE — Telephone Encounter (Signed)
Ok. Let's print a copy of your note for paper chart so we'll clearly see when checking in

## 2013-03-08 NOTE — Telephone Encounter (Signed)
Pt came in today 03/08/13 thinking it was her appt time, but she was scheduled for the 20th of March instead. She stated that she called on the 19th of March an we wouldn't have given her next day appt. I advised that we do indeed sometimes have next day or even sometimes same day OV appt.   I looked up info and saw that I actually made her appt for the 20th on the 17th of March.  I then offered to reschedule her appt but pt said never mind and left upset at that point.  This is just for your information.

## 2013-03-09 ENCOUNTER — Ambulatory Visit (INDEPENDENT_AMBULATORY_CARE_PROVIDER_SITE_OTHER): Payer: 59 | Admitting: Nurse Practitioner

## 2013-03-09 DIAGNOSIS — K219 Gastro-esophageal reflux disease without esophagitis: Secondary | ICD-10-CM

## 2013-03-09 MED ORDER — LORCASERIN HCL 10 MG PO TABS: 10.0000 mg | ORAL_TABLET | Freq: Two times a day (BID) | ORAL | Status: DC

## 2013-03-10 DIAGNOSIS — K219 Gastro-esophageal reflux disease without esophagitis: Secondary | ICD-10-CM | POA: Insufficient documentation

## 2013-03-10 NOTE — Progress Notes (Signed)
Subjective:  Presents to discuss her most recent lab work. Is being followed by Dr. Lurene Shadow for her thyroid. Has just had blood work done. Continues to struggle with her weight. Has not been exercising as much as usual, has decreased her walking. Has tried weight watchers and off and my fitness pal in the past to help with her weight. No overt depression symptoms, states she just feels bad about her weight. Reflux has been stable. No abdominal pain.  Objective:   BP 120/78  Wt 233 lb 3.2 oz (105.779 kg)  BMI 38.81 kg/m2  LMP 02/21/2013 NAD. Alert, oriented. Lungs clear. Heart regular rate rhythm. Abdomen soft nondistended nontender. Labs dated 3/11 lipid profile total cholesterol 214, triglycerides 207, HDL 41, VLDL 41 and LDL 132. Fasting glucose is 90. Of note alkaline phosphatase 136. Liver enzymes normal.

## 2013-03-10 NOTE — Assessment & Plan Note (Signed)
Discussed options. Start Belviq 10 mg twice a day. Given information on potential adverse effects. Weight loss goal of 10% of her body weight. Also low-fat low-cholesterol diet. Recheck in 3 months, call back sooner if any problems.

## 2013-03-10 NOTE — Assessment & Plan Note (Signed)
Stable. Patient using dexilant on a when necessary basis.

## 2013-04-27 ENCOUNTER — Encounter: Payer: Self-pay | Admitting: Family Medicine

## 2013-04-27 ENCOUNTER — Ambulatory Visit (INDEPENDENT_AMBULATORY_CARE_PROVIDER_SITE_OTHER): Payer: 59 | Admitting: Family Medicine

## 2013-04-27 VITALS — BP 134/96 | Temp 99.3°F | Ht 64.0 in | Wt 226.2 lb

## 2013-04-27 DIAGNOSIS — J209 Acute bronchitis, unspecified: Secondary | ICD-10-CM

## 2013-04-27 MED ORDER — DOXYCYCLINE HYCLATE 100 MG PO TABS: 100.0000 mg | ORAL_TABLET | Freq: Two times a day (BID) | ORAL | Status: DC

## 2013-04-27 NOTE — Progress Notes (Signed)
  Subjective:    Patient ID: [name], female    DOB: 1966/01/26, 47 y.o.   MRN: [id number]  Cough This is a new problem. The current episode started 1 to 4 weeks ago. The problem has been gradually worsening. The problem occurs constantly. The cough is productive of purulent sputum. Associated symptoms include chills, ear congestion and nasal congestion. Pertinent negatives include no ear pain. The symptoms are aggravated by pollens. She has tried OTC cough suppressant for the symptoms. The treatment provided mild relief.  Sore Throat  Associated symptoms include coughing. Pertinent negatives include no ear pain.   Pos illness at home. Daughter on amox. macken cleared   Review of Systems  Constitutional: Positive for chills.  HENT: Negative for ear pain.   Respiratory: Positive for cough.    Neg head ache     Objective:   Physical Exam  Alert moderate malaise. HEENT moderate nasal congestion. Frontal tenderness. Pharynx no lymphadenitis. Neck supple. Lungs rare rhonchi heart regular rate and rhythm.      Assessment & Plan:  Impression sinusitis bronchitis. Plan antibiotics prescribed. Symptomatic care discussed.

## 2013-08-06 ENCOUNTER — Ambulatory Visit (INDEPENDENT_AMBULATORY_CARE_PROVIDER_SITE_OTHER): Payer: 59 | Admitting: Nurse Practitioner

## 2013-08-06 ENCOUNTER — Encounter: Payer: Self-pay | Admitting: Nurse Practitioner

## 2013-08-06 VITALS — BP 134/88 | Ht 64.5 in | Wt 226.0 lb

## 2013-08-06 DIAGNOSIS — F411 Generalized anxiety disorder: Secondary | ICD-10-CM

## 2013-08-06 MED ORDER — PHENTERMINE HCL 37.5 MG PO TABS: 37.5000 mg | ORAL_TABLET | Freq: Every day | ORAL | Status: DC

## 2013-08-06 MED ORDER — ALPRAZOLAM 0.5 MG PO TABS: ORAL_TABLET | ORAL | Status: DC

## 2013-08-10 NOTE — Assessment & Plan Note (Signed)
Reviewed potential adverse effects of phentermine. Stop med and call if any problems. Recommend that she starts off with half a tablet. Encouraged healthy diet and regular activity. Recheck in one month. Do not take at the same time as her thyroid medication.

## 2013-08-10 NOTE — Progress Notes (Signed)
Subjective:  Presents for followup. Continues followup with her endocrinologist regarding her thyroid. Has had mixed results taking Belviq. Did cause some loose stools otherwise well-tolerated. Has been trying to increase her activity. Overall healthy diet. Has been having some anxiety symptoms. Defers daily medicine at this point, feels she needs something on a when necessary basis.  Objective:   BP 134/88  Ht 5' 4.5" (1.638 m)  Wt 226 lb (102.513 kg)  BMI 38.21 kg/m2 NAD. Alert, oriented. Lungs clear. Heart regular rate rhythm.  Assessment:Anxiety state, unspecified  Morbid obesity   Plan: Meds ordered this encounter  Medications  . phentermine (ADIPEX-P) 37.5 MG tablet    Sig: Take 1 tablet (37.5 mg total) by mouth daily before breakfast.    Dispense:  30 tablet    Refill:  0    Order Specific Question:  Supervising Provider    Answer:  Merlyn Albert [[id number]]  . ALPRAZolam (XANAX) 0.5 MG tablet    Sig: 1/2 - 1 po BID prn    Dispense:  30 tablet    Refill:  2    Order Specific Question:  Supervising Provider    Answer:  Riccardo Dubin   Reviewed potential adverse effects of phentermine. Stop med and call if any problems. Recommend that she starts off with half a tablet. Encouraged healthy diet and regular activity. Recheck in one month. Do not take at the same time as her thyroid medication.

## 2013-10-23 ENCOUNTER — Encounter: Payer: Self-pay | Admitting: Family Medicine

## 2013-10-23 ENCOUNTER — Ambulatory Visit (INDEPENDENT_AMBULATORY_CARE_PROVIDER_SITE_OTHER): Payer: 59 | Admitting: Family Medicine

## 2013-10-23 VITALS — BP 120/82 | Temp 98.4°F | Ht 64.0 in | Wt 230.4 lb

## 2013-10-23 DIAGNOSIS — R079 Chest pain, unspecified: Secondary | ICD-10-CM

## 2013-10-23 MED ORDER — DICLOFENAC SODIUM 75 MG PO TBEC: 75.0000 mg | DELAYED_RELEASE_TABLET | Freq: Two times a day (BID) | ORAL | Status: DC

## 2013-10-23 NOTE — Progress Notes (Signed)
  Subjective:    Patient ID: [name], female    DOB: 12-30-1965, 47 y.o.   MRN: [id number]  Abdominal Pain This is a new problem. The current episode started in the past 7 days. The onset quality is sudden. The problem occurs intermittently. The problem has been unchanged. The pain is located in the epigastric region. The pain is at a severity of 8/10. The pain is moderate. The quality of the pain is burning. The abdominal pain radiates to the chest. Nothing aggravates the pain. The pain is relieved by nothing. Treatments tried: advil. The treatment provided no relief.   Pain sharp worse with pressure  Deep breaths hurt ant substernal  Recalls new exercise last wk lifting with weights  Subcostal pain rad , worse with motion   Review of Systems  Gastrointestinal: Positive for abdominal pain.   no back pain no nausea no diaphoresis no shortness of breath no cough ROS otherwise negative     Objective:   Physical Exam  Alert lungs clear. Heart regular in rhythm. Anterior chest wall tender to palpation of her sternum left costal margin tender to palpation heart normal lungs clear abdomen benign      Assessment & Plan:  Impression costochondritis likely secondary to upper extremity weight lifting last week. Plan Voltaren twice a day with food when necessary. Symptomatic care discussed. WSL

## 2013-11-22 ENCOUNTER — Other Ambulatory Visit: Payer: Self-pay | Admitting: Family Medicine

## 2014-01-18 ENCOUNTER — Encounter: Payer: Self-pay | Admitting: Family Medicine

## 2014-01-18 ENCOUNTER — Ambulatory Visit (INDEPENDENT_AMBULATORY_CARE_PROVIDER_SITE_OTHER): Payer: 59 | Admitting: Family Medicine

## 2014-01-18 VITALS — BP 106/74 | Temp 98.5°F | Ht 64.0 in | Wt 232.5 lb

## 2014-01-18 DIAGNOSIS — J029 Acute pharyngitis, unspecified: Secondary | ICD-10-CM

## 2014-01-18 DIAGNOSIS — I889 Nonspecific lymphadenitis, unspecified: Secondary | ICD-10-CM

## 2014-01-18 LAB — POCT RAPID STREP A (OFFICE): Rapid Strep A Screen: NEGATIVE

## 2014-01-18 MED ORDER — DOXYCYCLINE HYCLATE 100 MG PO TABS: 100.0000 mg | ORAL_TABLET | Freq: Two times a day (BID) | ORAL | Status: DC

## 2014-01-18 NOTE — Progress Notes (Signed)
   Subjective:    Patient ID: [name], female    DOB: [birth year], 48 y.o.   MRN: [id number]  Sore Throat  This is a new problem. The current episode started in the past 7 days. The problem has been gradually worsening. Neither side of throat is experiencing more pain than the other. There has been no fever. The pain is at a severity of 7/10. The pain is moderate. Associated symptoms include ear pain. She has had exposure to strep. She has tried acetaminophen for the symptoms. The treatment provided no relief.    Results for orders placed in visit on 01/18/14  POCT RAPID STREP A (OFFICE)      Result Value Range   Rapid Strep A Screen Negative  Negative   Started hurting around wed  Gotten worse  Feels like a ear ache  White spots on throat  Strep in the workplace   No sig fever, haven't cked it   discomfort  Review of Systems  HENT: Positive for ear pain.    see below     Objective:   Physical Exam Alert mild malaise. Vitals reviewed. Lungs clear. Heart regular in rhythm. HEENT TMs normal left anterior glands very tender. Cervical region. Pustules noted and tonsil       Assessment & Plan:  Minimal cough no chest pain no back pain low-grade fever ROS otherwise negative  Impression cryptic tonsillitis with lymphadenitis discussed plan appropriate antibiotics. Symptomatic care discussed. WSL

## 2014-01-19 LAB — STREP A DNA PROBE: GASP: NEGATIVE

## 2014-02-21 ENCOUNTER — Other Ambulatory Visit: Payer: Self-pay | Admitting: Family Medicine

## 2014-06-13 ENCOUNTER — Other Ambulatory Visit: Payer: Self-pay | Admitting: Nurse Practitioner

## 2014-06-13 NOTE — Telephone Encounter (Signed)
Ok plus 2 ref 

## 2014-11-14 ENCOUNTER — Other Ambulatory Visit (HOSPITAL_COMMUNITY): Payer: Self-pay | Admitting: Endocrinology

## 2014-11-14 DIAGNOSIS — E041 Nontoxic single thyroid nodule: Secondary | ICD-10-CM

## 2014-11-18 ENCOUNTER — Ambulatory Visit (HOSPITAL_COMMUNITY)
Admission: RE | Admit: 2014-11-18 | Discharge: 2014-11-18 | Disposition: A | Discharge status: Home / Self Care | Payer: 59 | Source: Ambulatory Visit | Attending: Endocrinology | Admitting: Endocrinology

## 2014-11-18 DIAGNOSIS — E039 Hypothyroidism, unspecified: Secondary | ICD-10-CM | POA: Diagnosis present

## 2014-11-18 DIAGNOSIS — R221 Localized swelling, mass and lump, neck: Secondary | ICD-10-CM | POA: Diagnosis not present

## 2014-11-18 DIAGNOSIS — E041 Nontoxic single thyroid nodule: Secondary | ICD-10-CM

## 2015-02-27 ENCOUNTER — Other Ambulatory Visit: Payer: Self-pay | Admitting: Family Medicine

## 2015-03-17 ENCOUNTER — Ambulatory Visit (INDEPENDENT_AMBULATORY_CARE_PROVIDER_SITE_OTHER): Payer: 59 | Admitting: Family Medicine

## 2015-03-17 ENCOUNTER — Encounter: Payer: Self-pay | Admitting: Family Medicine

## 2015-03-17 VITALS — BP 112/68 | Ht 65.0 in | Wt 236.0 lb

## 2015-03-17 DIAGNOSIS — R5383 Other fatigue: Secondary | ICD-10-CM

## 2015-03-17 DIAGNOSIS — R079 Chest pain, unspecified: Secondary | ICD-10-CM

## 2015-03-17 DIAGNOSIS — Z13220 Encounter for screening for lipoid disorders: Secondary | ICD-10-CM

## 2015-03-17 NOTE — Progress Notes (Signed)
   Subjective:    Patient ID: [name], female    DOB: 07-Oct-1966, 49 y.o.   MRN: [id number]  Dizziness This is a new problem. Episode onset: 1 week ago. Associated symptoms comments: lightheaded.   Going to endocrinologist for the thyroid, had incr the dose to 112 because feeling sluggish and thyr off  Had trouble swallowing and had an ultrasound to assess and normal   Now still feeling jittery despite normal tsh's  Walks thirty min per day, walks on a flat surf ok, but sob when climbing steps   Saw the endocrin a couple wks ago  And they rec card ref  This weekend felt rough, pt felt fine and then drank tea and felt jittery.  Today ate and lightheaded after eating Seeing dr Suzette Battiest,  Takes xanax hardly not at all, but taking more freq lately  Takes xanax to help the "wired" edge   Patient claims no excess stress just usual. Does note some anxiety at times. Feels the Xanax does help to anxiety. Next  Patient is concerned that her thyroid is not in good control. Next  Patient also has intermittent spells of fatigue that concerns her. She wonders as to what the source of this is  Review of Systems  Neurological: Positive for dizziness.   no chest pain no abdominal pain no change in bowel habits no blood in stool     Objective:   Physical Exam Alert no acute distress talkative vital stable HEENT thyroid normal neck no adenopathy lungs clear. Heart regular in rhythm. Cerebellar function normal. No tremor. Strength deep tendon reflexes intact neuro grossly intact       Assessment & Plan:  Impression 1 hypothyroidism status uncertain #2 fatigue nonspecific #3 intermittent anxiety #4 "spells" dizzy in nature. Difficult to pin down plan appropriate blood work. Exercise encourage. Get back with endocrinologist. Further recommendations based on blood work WSL

## 2015-03-19 LAB — CBC WITH DIFFERENTIAL/PLATELET
Basophils Absolute: 0 10*3/uL (ref 0.0–0.2)
Basos: 0 %
EOS: 1 %
Eosinophils Absolute: 0.1 10*3/uL (ref 0.0–0.4)
HEMATOCRIT: 39.4 % (ref 34.0–46.6)
Hemoglobin: 13.2 g/dL (ref 11.1–15.9)
IMMATURE GRANS (ABS): 0 10*3/uL (ref 0.0–0.1)
IMMATURE GRANULOCYTES: 0 %
LYMPHS ABS: 1.5 10*3/uL (ref 0.7–3.1)
LYMPHS: 19 %
MCH: 26.4 pg — ABNORMAL LOW (ref 26.6–33.0)
MCHC: 33.5 g/dL (ref 31.5–35.7)
MCV: 79 fL (ref 79–97)
MONOCYTES: 7 %
Monocytes Absolute: 0.6 10*3/uL (ref 0.1–0.9)
NEUTROS ABS: 5.7 10*3/uL (ref 1.4–7.0)
Neutrophils Relative %: 73 %
PLATELETS: 338 10*3/uL (ref 150–379)
RBC: 5 x10E6/uL (ref 3.77–5.28)
RDW: 15.6 % — ABNORMAL HIGH (ref 12.3–15.4)
WBC: 7.8 10*3/uL (ref 3.4–10.8)

## 2015-03-19 LAB — HEPATIC FUNCTION PANEL
ALBUMIN: 4.2 g/dL (ref 3.5–5.5)
ALK PHOS: 128 IU/L — AB (ref 39–117)
ALT: 19 IU/L (ref 0–32)
AST: 24 IU/L (ref 0–40)
BILIRUBIN TOTAL: 1 mg/dL (ref 0.0–1.2)
Bilirubin, Direct: 0.19 mg/dL (ref 0.00–0.40)
TOTAL PROTEIN: 7.3 g/dL (ref 6.0–8.5)

## 2015-03-19 LAB — BASIC METABOLIC PANEL
BUN / CREAT RATIO: 9 (ref 9–23)
BUN: 9 mg/dL (ref 6–24)
CHLORIDE: 102 mmol/L (ref 97–108)
CO2: 24 mmol/L (ref 18–29)
Calcium: 9.6 mg/dL (ref 8.7–10.2)
Creatinine, Ser: 1.01 mg/dL — ABNORMAL HIGH (ref 0.57–1.00)
GFR calc non Af Amer: 66 mL/min/{1.73_m2} (ref >59)
GFR, EST AFRICAN AMERICAN: 77 mL/min/{1.73_m2} (ref >59)
Glucose: 100 mg/dL — ABNORMAL HIGH (ref 65–99)
Potassium: 4.6 mmol/L (ref 3.5–5.2)
SODIUM: 141 mmol/L (ref 134–144)

## 2015-03-19 LAB — MAGNESIUM: Magnesium: 2.1 mg/dL (ref 1.6–2.3)

## 2015-03-19 LAB — LIPID PANEL
CHOLESTEROL TOTAL: 228 mg/dL — AB (ref 100–199)
Chol/HDL Ratio: 5.2 ratio units — ABNORMAL HIGH (ref 0.0–4.4)
HDL: 44 mg/dL (ref >39)
LDL Calculated: 149 mg/dL — ABNORMAL HIGH (ref 0–99)
Triglycerides: 176 mg/dL — ABNORMAL HIGH (ref 0–149)
VLDL Cholesterol Cal: 35 mg/dL (ref 5–40)

## 2015-03-19 LAB — TSH: TSH: 4.09 u[IU]/mL (ref 0.450–4.500)

## 2015-03-19 LAB — T4: T4, Total: 11.5 ug/dL (ref 4.5–12.0)

## 2015-03-19 NOTE — Progress Notes (Signed)
Patient notified and verbalized understanding of the test results. No further questions. 

## 2015-04-02 ENCOUNTER — Encounter: Payer: Self-pay | Admitting: Family Medicine

## 2015-04-11 ENCOUNTER — Encounter: Payer: Self-pay | Admitting: Nurse Practitioner

## 2015-04-11 ENCOUNTER — Ambulatory Visit (INDEPENDENT_AMBULATORY_CARE_PROVIDER_SITE_OTHER): Payer: 59 | Admitting: Nurse Practitioner

## 2015-04-11 VITALS — BP 136/90 | Ht 65.0 in | Wt 234.2 lb

## 2015-04-11 DIAGNOSIS — R739 Hyperglycemia, unspecified: Secondary | ICD-10-CM | POA: Diagnosis not present

## 2015-04-11 DIAGNOSIS — N943 Premenstrual tension syndrome: Secondary | ICD-10-CM | POA: Diagnosis not present

## 2015-04-11 DIAGNOSIS — F3281 Premenstrual dysphoric disorder: Secondary | ICD-10-CM

## 2015-04-11 LAB — POCT GLYCOSYLATED HEMOGLOBIN (HGB A1C): HEMOGLOBIN A1C: 5.5

## 2015-04-11 MED ORDER — ALPRAZOLAM 0.5 MG PO TABS: ORAL_TABLET | ORAL | Status: DC

## 2015-04-11 NOTE — Patient Instructions (Signed)
Flaxseed St. John's wort

## 2015-04-13 ENCOUNTER — Encounter: Payer: Self-pay | Admitting: Nurse Practitioner

## 2015-04-13 DIAGNOSIS — F3281 Premenstrual dysphoric disorder: Secondary | ICD-10-CM | POA: Insufficient documentation

## 2015-04-13 NOTE — Progress Notes (Signed)
Subjective:  Presents for c/o jitteriness, shakiness and lightheadedness that occurs off/on about 5 days before her period starts. This is the only time it occurs. No palpitations. No CP/ischemic type pain or SOB. BTL for birth control. Menses fairly regular lasting about 6 days with normal flow. Xanax helps symptoms. Has cut back on caffeine; 2 cups per day. Regular follow up with endocrinology. Thyroid dose is stable. Sees GYN for physicals.  Objective:   BP 136/90 mmHg  Ht 5\' 5"  (1.651 m)  Wt 234 lb 3.2 oz (106.232 kg)  BMI 38.97 kg/m2  LMP 03/19/2015 NAD. Alert, oriented. Lungs clear. Heart RRR. No murmur or gallop. Lower extremities no edema.  Results for orders placed or performed in visit on 04/11/15  POCT HgB A1C  Result Value Ref Range   Hemoglobin A1C 5.5      Assessment: Premenstrual dysphoric disorder  Hyperglycemia - Plan: POCT HgB A1C  Plan:  Meds ordered this encounter  Medications  . ALPRAZolam (XANAX) 0.5 MG tablet    Sig: TAKE 1/2 TO 1 TABLET BY MOUTH TWICE DAILY AS NEEDED    Dispense:  30 tablet    Refill:  2    Order Specific Question:  Supervising Provider    Answer:  Mikey Kirschner [[id number]]   Discussed options including hormones. Prefers to use natural supplements. Use Xanax sparingly for extreme symptoms. Return if symptoms worsen or fail to improve.

## 2015-10-07 ENCOUNTER — Other Ambulatory Visit: Payer: Self-pay | Admitting: Nurse Practitioner

## 2015-10-07 NOTE — Telephone Encounter (Signed)
Ok six mo worth 

## 2015-11-07 ENCOUNTER — Ambulatory Visit (INDEPENDENT_AMBULATORY_CARE_PROVIDER_SITE_OTHER): Payer: 59 | Admitting: *Deleted

## 2015-11-07 DIAGNOSIS — Z23 Encounter for immunization: Secondary | ICD-10-CM

## 2015-12-05 ENCOUNTER — Encounter: Payer: Self-pay | Admitting: Nurse Practitioner

## 2015-12-05 ENCOUNTER — Ambulatory Visit (INDEPENDENT_AMBULATORY_CARE_PROVIDER_SITE_OTHER): Payer: 59 | Admitting: Nurse Practitioner

## 2015-12-05 VITALS — BP 130/80 | Temp 98.3°F | Ht 65.0 in | Wt 235.1 lb

## 2015-12-05 DIAGNOSIS — J329 Chronic sinusitis, unspecified: Secondary | ICD-10-CM | POA: Diagnosis not present

## 2015-12-05 DIAGNOSIS — K2100 Gastro-esophageal reflux disease with esophagitis, without bleeding: Secondary | ICD-10-CM

## 2015-12-05 DIAGNOSIS — J310 Chronic rhinitis: Secondary | ICD-10-CM

## 2015-12-05 MED ORDER — DOXYCYCLINE HYCLATE 100 MG PO TABS: 100.0000 mg | ORAL_TABLET | Freq: Two times a day (BID) | ORAL | Status: DC

## 2015-12-05 NOTE — Patient Instructions (Signed)
Food Choices for Gastroesophageal Reflux Disease, Adult When you have gastroesophageal reflux disease (GERD), the foods you eat and your eating habits are very important. Choosing the right foods can help ease the discomfort of GERD. WHAT GENERAL GUIDELINES DO I NEED TO FOLLOW?  Choose fruits, vegetables, whole grains, low-fat dairy products, and low-fat meat, fish, and poultry.  Limit fats such as oils, salad dressings, butter, nuts, and avocado.  Keep a food diary to identify foods that cause symptoms.  Avoid foods that cause reflux. These may be different for different people.  Eat frequent small meals instead of three large meals each day.  Eat your meals slowly, in a relaxed setting.  Limit fried foods.  Cook foods using methods other than frying.  Avoid drinking alcohol.  Avoid drinking large amounts of liquids with your meals.  Avoid bending over or lying down until 2-3 hours after eating. WHAT FOODS ARE NOT RECOMMENDED? The following are some foods and drinks that may worsen your symptoms: Vegetables Tomatoes. Tomato juice. Tomato and spaghetti sauce. Chili peppers. Onion and garlic. Horseradish. Fruits Oranges, grapefruit, and lemon (fruit and juice). Meats High-fat meats, fish, and poultry. This includes hot dogs, ribs, ham, sausage, salami, and bacon. Dairy Whole milk and chocolate milk. Sour cream. Cream. Butter. Ice cream. Cream cheese.  Beverages Coffee and tea, with or without caffeine. Carbonated beverages or energy drinks. Condiments Hot sauce. Barbecue sauce.  Sweets/Desserts Chocolate and cocoa. Donuts. Peppermint and spearmint. Fats and Oils High-fat foods, including French fries and potato chips. Other Vinegar. Strong spices, such as black pepper, white pepper, red pepper, cayenne, curry powder, cloves, ginger, and chili powder. The items listed above may not be a complete list of foods and beverages to avoid. Contact your dietitian for more  information.   This information is not intended to replace advice given to you by your health care provider. Make sure you discuss any questions you have with your health care provider.   Document Released: 11/29/2005 Document Revised: 12/20/2014 Document Reviewed: 10/03/2013 Elsevier Interactive Patient Education 2016 Elsevier Inc.  

## 2015-12-06 ENCOUNTER — Encounter: Payer: Self-pay | Admitting: Nurse Practitioner

## 2015-12-06 NOTE — Progress Notes (Signed)
Subjective:  Present for c/o cough and congestion for the past week. Fever and headache resolved. Sore throat. Runny nose. Nonproductive cough worse at night. Chest tightness with DB and cough. Left ear pain. Increased acid reflux for the past week. Feels like throat is closing at times at night. No SOB. Taking Omeprazole OTC once daily. Has decreased caffeine intake. No alcohol, tobacco or NSAID use.   Objective:   BP 130/80 mmHg  Temp(Src) 98.3 F (36.8 C) (Oral)  Ht 5\' 5"  (1.651 m)  Wt 235 lb 2 oz (106.652 kg)  BMI 39.13 kg/m2 NAD. Alert, oriented. TMs clear effusion. Pharynx injected with PND noted. Neck supple with mild anterior adenopathy. Lungs clear. Heart RRR. Abdomen soft, non distended with minimal epigastric area tenderness.   Assessment:  Problem List Items Addressed This Visit      Digestive   GERD (gastroesophageal reflux disease)    Other Visit Diagnoses    Rhinosinusitis    -  Primary    Relevant Medications    doxycycline (VIBRA-TABS) 100 MG tablet       Plan:  Meds ordered this encounter  Medications  . SYNTHROID 100 MCG tablet    Sig: TK 1 T PO QD    Refill:  5  . doxycycline (VIBRA-TABS) 100 MG tablet    Sig: Take 1 tablet (100 mg total) by mouth 2 (two) times daily.    Dispense:  20 tablet    Refill:  0    Order Specific Question:  Supervising Provider    Answer:  Mikey Kirschner [[id number]]   Increase Omeprazole to BID dosing. Given information on dietary measures that may help. Call back in 2 weeks if not resolved. OTC meds as directed for congestion. Call back if worsens or persists.

## 2016-02-06 ENCOUNTER — Encounter: Payer: Self-pay | Admitting: Family Medicine

## 2016-02-06 ENCOUNTER — Ambulatory Visit (INDEPENDENT_AMBULATORY_CARE_PROVIDER_SITE_OTHER): Payer: 59 | Admitting: Family Medicine

## 2016-02-06 ENCOUNTER — Ambulatory Visit (HOSPITAL_COMMUNITY)
Admission: RE | Admit: 2016-02-06 | Discharge: 2016-02-06 | Disposition: A | Discharge status: Home / Self Care | Payer: BLUE CROSS/BLUE SHIELD | Source: Ambulatory Visit | Attending: Family Medicine | Admitting: Family Medicine

## 2016-02-06 VITALS — BP 128/86 | Ht 65.0 in | Wt 236.5 lb

## 2016-02-06 DIAGNOSIS — M25462 Effusion, left knee: Secondary | ICD-10-CM | POA: Diagnosis not present

## 2016-02-06 DIAGNOSIS — M25562 Pain in left knee: Secondary | ICD-10-CM | POA: Diagnosis present

## 2016-02-06 DIAGNOSIS — M11262 Other chondrocalcinosis, left knee: Secondary | ICD-10-CM | POA: Diagnosis not present

## 2016-02-06 MED ORDER — ETODOLAC 400 MG PO TABS: ORAL_TABLET | ORAL | Status: DC
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
## 2016-03-15 ENCOUNTER — Telehealth: Payer: Self-pay | Admitting: Family Medicine

## 2016-03-15 DIAGNOSIS — Z139 Encounter for screening, unspecified: Secondary | ICD-10-CM

## 2016-03-15 NOTE — Telephone Encounter (Signed)
Spoke with patient and informed her per Dr.Steve Rockwood for upcoming physical have been ordered. Informed patient that she needs to be fasting prior to labs. Patient verbalized understanding.

## 2016-03-15 NOTE — Telephone Encounter (Signed)
Lip liv m7 t4 tsh

## 2016-03-15 NOTE — Telephone Encounter (Signed)
Patient has physical with Hoyle Sauer on 4/20 and needing lab work done.

## 2016-03-31 LAB — LIPID PANEL
CHOLESTEROL TOTAL: 226 mg/dL — AB (ref 100–199)
Chol/HDL Ratio: 4.4 ratio units (ref 0.0–4.4)
HDL: 51 mg/dL (ref >39)
LDL Calculated: 133 mg/dL — ABNORMAL HIGH (ref 0–99)
Triglycerides: 211 mg/dL — ABNORMAL HIGH (ref 0–149)
VLDL Cholesterol Cal: 42 mg/dL — ABNORMAL HIGH (ref 5–40)

## 2016-03-31 LAB — BASIC METABOLIC PANEL
BUN / CREAT RATIO: 13 (ref 9–23)
BUN: 12 mg/dL (ref 6–24)
CO2: 24 mmol/L (ref 18–29)
CREATININE: 0.94 mg/dL (ref 0.57–1.00)
Calcium: 9.5 mg/dL (ref 8.7–10.2)
Chloride: 101 mmol/L (ref 96–106)
GFR, EST AFRICAN AMERICAN: 83 mL/min/{1.73_m2} (ref >59)
GFR, EST NON AFRICAN AMERICAN: 72 mL/min/{1.73_m2} (ref >59)
Glucose: 91 mg/dL (ref 65–99)
POTASSIUM: 4.7 mmol/L (ref 3.5–5.2)
SODIUM: 140 mmol/L (ref 134–144)

## 2016-03-31 LAB — HEPATIC FUNCTION PANEL
ALBUMIN: 4.1 g/dL (ref 3.5–5.5)
ALK PHOS: 107 IU/L (ref 39–117)
ALT: 16 IU/L (ref 0–32)
AST: 19 IU/L (ref 0–40)
BILIRUBIN TOTAL: 0.8 mg/dL (ref 0.0–1.2)
BILIRUBIN, DIRECT: 0.16 mg/dL (ref 0.00–0.40)
TOTAL PROTEIN: 7.3 g/dL (ref 6.0–8.5)

## 2016-03-31 LAB — TSH: TSH: 3.28 u[IU]/mL (ref 0.450–4.500)

## 2016-03-31 LAB — T4: T4, Total: 9.1 ug/dL (ref 4.5–12.0)

## 2016-04-01 ENCOUNTER — Encounter: Payer: Self-pay | Admitting: Nurse Practitioner

## 2016-04-01 ENCOUNTER — Ambulatory Visit (INDEPENDENT_AMBULATORY_CARE_PROVIDER_SITE_OTHER): Payer: 59 | Admitting: Nurse Practitioner

## 2016-04-01 VITALS — BP 116/76 | Ht 65.0 in | Wt 234.2 lb

## 2016-04-01 DIAGNOSIS — K219 Gastro-esophageal reflux disease without esophagitis: Secondary | ICD-10-CM

## 2016-04-01 DIAGNOSIS — E039 Hypothyroidism, unspecified: Secondary | ICD-10-CM | POA: Diagnosis not present

## 2016-04-01 MED ORDER — PANTOPRAZOLE SODIUM 40 MG PO TBEC: 40.0000 mg | DELAYED_RELEASE_TABLET | Freq: Every day | ORAL | Status: DC

## 2016-04-01 MED ORDER — SYNTHROID 100 MCG PO TABS: ORAL_TABLET | ORAL | Status: DC

## 2016-04-02 ENCOUNTER — Encounter: Payer: Self-pay | Admitting: Nurse Practitioner

## 2016-04-02 NOTE — Progress Notes (Signed)
Subjective:  Presents for routine follow-up. Gets regular physicals including Pap smear and mammogram through gynecology. Also here to review her lab work done 2 days ago. Trying to eat a healthy diet. Walking 20-30 minutes per day. Minimal caffeine intake. Nonsmoker. No alcohol use. Has a history of GERD, ran out of her omeprazole. Has been taking Zantac which is not working as well. Having some upper mid abdominal discomfort at times. No nausea vomiting. Requesting that we prescribe her thyroid medicine, has no plans to go back to endocrinology at this time. They have tried to increase her thyroid medicine but this causes palpitations. Is doing great on this current dosage.  Objective:   BP 116/76 mmHg  Ht 5\' 5"  (1.651 m)  Wt 234 lb 4 oz (106.255 kg)  BMI 38.98 kg/m2 NAD. Alert, oriented. Lungs clear. Heart regular rate rhythm. Mild discomfort noted in the epigastric area on exam. See lab work for 4/18.  Assessment:  Problem List Items Addressed This Visit      Digestive   GERD (gastroesophageal reflux disease) - Primary   Relevant Medications   pantoprazole (PROTONIX) 40 MG tablet     Endocrine   Hypothyroidism   Relevant Medications   SYNTHROID 100 MCG tablet     Plan:  Meds ordered this encounter  Medications  . SYNTHROID 100 MCG tablet    Sig: TK 1 T PO QD    Dispense:  30 tablet    Refill:  11    Order Specific Question:  Supervising Provider    Answer:  Mikey Kirschner [[id number]]  . pantoprazole (PROTONIX) 40 MG tablet    Sig: Take 1 tablet (40 mg total) by mouth daily.    Dispense:  30 tablet    Refill:  2    Order Specific Question:  Supervising Provider    Answer:  Mikey Kirschner [[id number]]   Hold on Zantac. Switch to pantoprazole. Patient to call back in 2 weeks if symptoms have not resolved. Our goal is for her to go to when necessary dosing than the next 2-3 months. Start daily omega-3 supplement for triglycerides. Low-fat, low-cholesterol diet. Continue exercise  program. Discussed options to help her with her weight loss. Repeat lipid and liver profile including vitamin D level in 6 months.

## 2016-04-05 ENCOUNTER — Encounter: Payer: Self-pay | Admitting: Nurse Practitioner

## 2016-05-24 ENCOUNTER — Other Ambulatory Visit: Payer: Self-pay | Admitting: Family Medicine

## 2016-05-24 NOTE — Telephone Encounter (Signed)
Six mo worth ok 

## 2016-07-22 ENCOUNTER — Other Ambulatory Visit: Payer: Self-pay | Admitting: Obstetrics & Gynecology

## 2016-07-22 DIAGNOSIS — R928 Other abnormal and inconclusive findings on diagnostic imaging of breast: Secondary | ICD-10-CM

## 2016-07-27 ENCOUNTER — Ambulatory Visit
Admission: RE | Admit: 2016-07-27 | Discharge: 2016-07-27 | Disposition: A | Discharge status: Home / Self Care | Payer: 59 | Source: Ambulatory Visit | Attending: Obstetrics & Gynecology | Admitting: Obstetrics & Gynecology

## 2016-07-27 ENCOUNTER — Ambulatory Visit
Admission: RE | Admit: 2016-07-27 | Discharge: 2016-07-27 | Disposition: A | Discharge status: Home / Self Care | Payer: BLUE CROSS/BLUE SHIELD | Source: Ambulatory Visit | Attending: Obstetrics & Gynecology | Admitting: Obstetrics & Gynecology

## 2016-07-27 DIAGNOSIS — R928 Other abnormal and inconclusive findings on diagnostic imaging of breast: Secondary | ICD-10-CM

## 2016-10-14 ENCOUNTER — Encounter: Payer: Self-pay | Admitting: Nurse Practitioner

## 2016-10-20 ENCOUNTER — Other Ambulatory Visit: Payer: Self-pay | Admitting: Nurse Practitioner

## 2016-10-20 DIAGNOSIS — R5383 Other fatigue: Secondary | ICD-10-CM

## 2016-10-20 DIAGNOSIS — E039 Hypothyroidism, unspecified: Secondary | ICD-10-CM

## 2016-10-20 DIAGNOSIS — Z13220 Encounter for screening for lipoid disorders: Secondary | ICD-10-CM

## 2016-10-22 LAB — CBC WITH DIFFERENTIAL/PLATELET
BASOS ABS: 0 10*3/uL (ref 0.0–0.2)
Basos: 0 %
EOS (ABSOLUTE): 0.3 10*3/uL (ref 0.0–0.4)
Eos: 4 %
Hematocrit: 36.7 % (ref 34.0–46.6)
Hemoglobin: 11.8 g/dL (ref 11.1–15.9)
Immature Grans (Abs): 0 10*3/uL (ref 0.0–0.1)
Immature Granulocytes: 0 %
LYMPHS ABS: 1.8 10*3/uL (ref 0.7–3.1)
Lymphs: 26 %
MCH: 24.6 pg — AB (ref 26.6–33.0)
MCHC: 32.2 g/dL (ref 31.5–35.7)
MCV: 77 fL — ABNORMAL LOW (ref 79–97)
MONOCYTES: 9 %
MONOS ABS: 0.6 10*3/uL (ref 0.1–0.9)
NEUTROS ABS: 4.3 10*3/uL (ref 1.4–7.0)
Neutrophils: 61 %
PLATELETS: 352 10*3/uL (ref 150–379)
RBC: 4.8 x10E6/uL (ref 3.77–5.28)
RDW: 15.4 % (ref 12.3–15.4)
WBC: 7 10*3/uL (ref 3.4–10.8)

## 2016-10-22 LAB — HEPATIC FUNCTION PANEL
ALBUMIN: 4 g/dL (ref 3.5–5.5)
ALK PHOS: 127 IU/L — AB (ref 39–117)
ALT: 11 IU/L (ref 0–32)
AST: 17 IU/L (ref 0–40)
Bilirubin Total: 0.9 mg/dL (ref 0.0–1.2)
Bilirubin, Direct: 0.15 mg/dL (ref 0.00–0.40)
TOTAL PROTEIN: 7 g/dL (ref 6.0–8.5)

## 2016-10-22 LAB — BASIC METABOLIC PANEL
BUN / CREAT RATIO: 13 (ref 9–23)
BUN: 12 mg/dL (ref 6–24)
CHLORIDE: 102 mmol/L (ref 96–106)
CO2: 26 mmol/L (ref 18–29)
Calcium: 9.3 mg/dL (ref 8.7–10.2)
Creatinine, Ser: 0.95 mg/dL (ref 0.57–1.00)
GFR calc non Af Amer: 71 mL/min/{1.73_m2} (ref >59)
GFR, EST AFRICAN AMERICAN: 81 mL/min/{1.73_m2} (ref >59)
GLUCOSE: 91 mg/dL (ref 65–99)
POTASSIUM: 4.6 mmol/L (ref 3.5–5.2)
Sodium: 142 mmol/L (ref 134–144)

## 2016-10-22 LAB — LIPID PANEL
CHOL/HDL RATIO: 4.6 ratio — AB (ref 0.0–4.4)
CHOLESTEROL TOTAL: 232 mg/dL — AB (ref 100–199)
HDL: 50 mg/dL (ref >39)
LDL CALC: 153 mg/dL — AB (ref 0–99)
TRIGLYCERIDES: 144 mg/dL (ref 0–149)
VLDL CHOLESTEROL CAL: 29 mg/dL (ref 5–40)

## 2016-10-22 LAB — TSH: TSH: 1.94 u[IU]/mL (ref 0.450–4.500)

## 2016-10-22 LAB — VITAMIN D 25 HYDROXY (VIT D DEFICIENCY, FRACTURES): Vit D, 25-Hydroxy: 36.4 ng/mL (ref 30.0–100.0)

## 2016-12-16 ENCOUNTER — Other Ambulatory Visit: Payer: Self-pay | Admitting: Family Medicine

## 2017-03-10 ENCOUNTER — Other Ambulatory Visit: Payer: Self-pay | Admitting: Nurse Practitioner

## 2017-04-06 ENCOUNTER — Other Ambulatory Visit: Payer: Self-pay | Admitting: Nurse Practitioner

## 2017-05-25 ENCOUNTER — Other Ambulatory Visit: Payer: Self-pay | Admitting: Nurse Practitioner

## 2017-06-06 ENCOUNTER — Other Ambulatory Visit: Payer: Self-pay | Admitting: Nurse Practitioner

## 2017-06-24 ENCOUNTER — Encounter: Payer: Self-pay | Admitting: Nurse Practitioner

## 2017-06-24 ENCOUNTER — Ambulatory Visit (INDEPENDENT_AMBULATORY_CARE_PROVIDER_SITE_OTHER): Payer: 59 | Admitting: Nurse Practitioner

## 2017-06-24 VITALS — BP 140/90 | Ht 65.0 in | Wt 241.0 lb

## 2017-06-24 DIAGNOSIS — E039 Hypothyroidism, unspecified: Secondary | ICD-10-CM | POA: Diagnosis not present

## 2017-06-24 DIAGNOSIS — R7303 Prediabetes: Secondary | ICD-10-CM | POA: Insufficient documentation

## 2017-06-24 DIAGNOSIS — R5383 Other fatigue: Secondary | ICD-10-CM

## 2017-06-24 LAB — POCT HEMOGLOBIN: Hemoglobin: 13 g/dL (ref 12.2–16.2)

## 2017-06-24 LAB — POCT GLYCOSYLATED HEMOGLOBIN (HGB A1C): Hemoglobin A1C: 5.8

## 2017-06-25 ENCOUNTER — Encounter: Payer: Self-pay | Admitting: Nurse Practitioner

## 2017-06-25 NOTE — Progress Notes (Signed)
Subjective:  Presents for recheck on her thyroid. Has been experiencing fatigue. Despite her best efforts great difficulty in losing weight. No palpitations. No sore throat or difficulty swallowing.   Objective:   BP 140/90   Ht 5\' 5"  (1.651 m)   Wt 241 lb (109.3 kg)   BMI 40.10 kg/m  NAD. Alert, oriented. Thyroid non tender, no mass or goiter noted. Lungs clear. Heart RRR. Significant central obesity.  Results for orders placed or performed in visit on 06/24/17  POCT HgB A1C  Result Value Ref Range   Hemoglobin A1C 5.8   POCT hemoglobin  Result Value Ref Range   Hemoglobin 13.0 12.2 - 16.2 g/dL     Assessment:   Problem List Items Addressed This Visit      Endocrine   Hypothyroidism - Primary   Relevant Orders   TSH     Other   Morbid obesity ([place])   Relevant Orders   POCT HgB A1C (Completed)   Prediabetes    Other Visit Diagnoses    Fatigue, unspecified type       Relevant Orders   POCT hemoglobin (Completed)   TSH       Plan:   Continue weight loss efforts. Discussed options including surgery and medications, specifically Saxenda and Contrave. Will research these on her own and get back with [place].   Return in about 6 months (around 12/25/2017) for recheck and labs.

## 2017-06-27 ENCOUNTER — Other Ambulatory Visit: Payer: Self-pay | Admitting: Nurse Practitioner

## 2017-06-27 DIAGNOSIS — R5383 Other fatigue: Secondary | ICD-10-CM | POA: Diagnosis not present

## 2017-06-27 DIAGNOSIS — E039 Hypothyroidism, unspecified: Secondary | ICD-10-CM | POA: Diagnosis not present

## 2017-06-28 ENCOUNTER — Other Ambulatory Visit: Payer: Self-pay | Admitting: Nurse Practitioner

## 2017-06-28 DIAGNOSIS — E039 Hypothyroidism, unspecified: Secondary | ICD-10-CM

## 2017-06-28 LAB — TSH: TSH: 4.82 u[IU]/mL — ABNORMAL HIGH (ref 0.450–4.500)

## 2017-06-28 MED ORDER — LEVOTHYROXINE SODIUM 112 MCG PO TABS: 112.0000 ug | ORAL_TABLET | Freq: Every day | ORAL | 2 refills | Status: DC

## 2017-07-22 ENCOUNTER — Other Ambulatory Visit: Payer: Self-pay | Admitting: Nurse Practitioner

## 2017-08-09 DIAGNOSIS — Z01419 Encounter for gynecological examination (general) (routine) without abnormal findings: Secondary | ICD-10-CM | POA: Diagnosis not present

## 2017-08-09 DIAGNOSIS — Z1231 Encounter for screening mammogram for malignant neoplasm of breast: Secondary | ICD-10-CM | POA: Diagnosis not present

## 2017-08-11 DIAGNOSIS — L258 Unspecified contact dermatitis due to other agents: Secondary | ICD-10-CM | POA: Diagnosis not present

## 2017-08-11 DIAGNOSIS — D225 Melanocytic nevi of trunk: Secondary | ICD-10-CM | POA: Diagnosis not present

## 2017-08-11 DIAGNOSIS — Z1283 Encounter for screening for malignant neoplasm of skin: Secondary | ICD-10-CM | POA: Diagnosis not present

## 2017-08-16 IMAGING — DX DG KNEE COMPLETE 4+V*L*
4 series · 4 of 4 positions shown · non-contrast
Comparison: None.

CLINICAL DATA: Left knee pain since injury 2 months ago. Painful
when walking.

EXAM:
LEFT KNEE - COMPLETE 4+ VIEW

[knee ap]
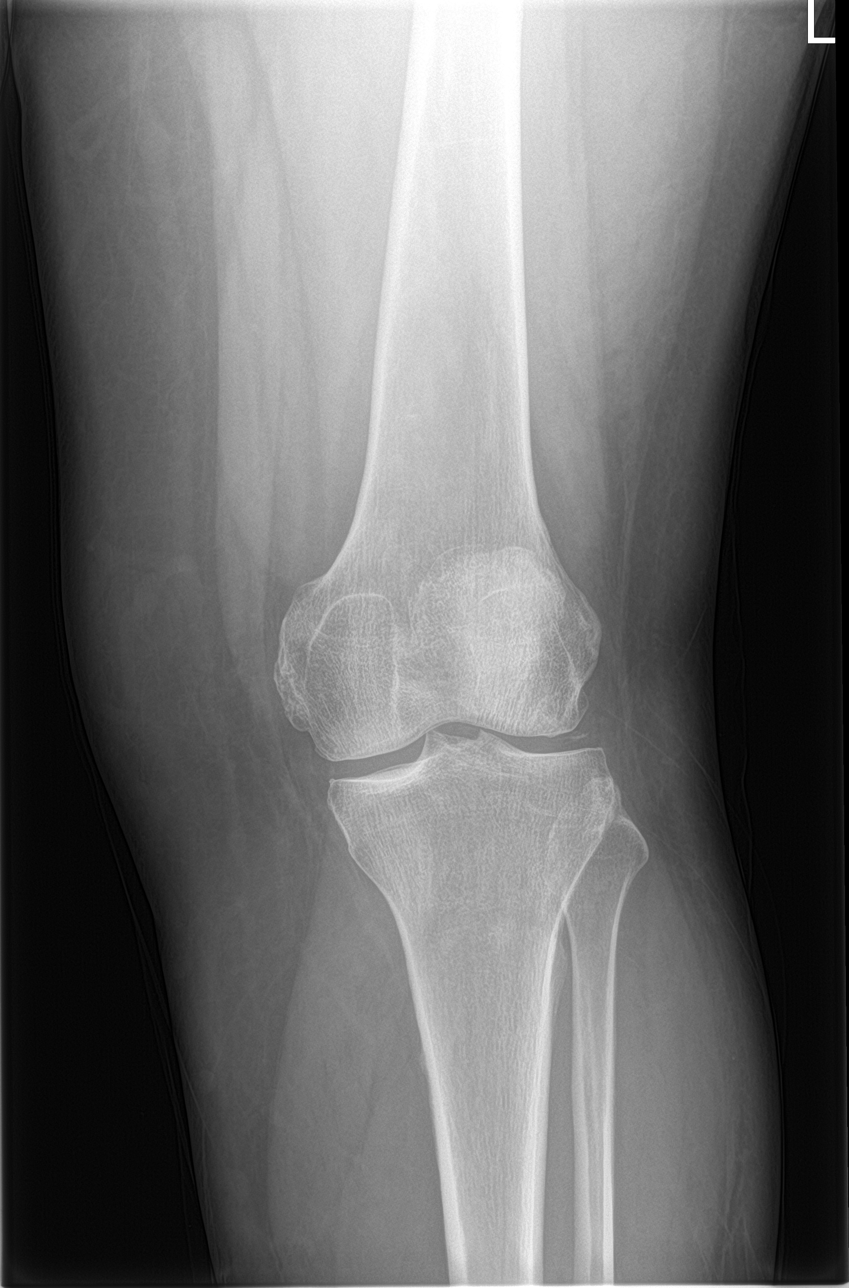

[tunnel]
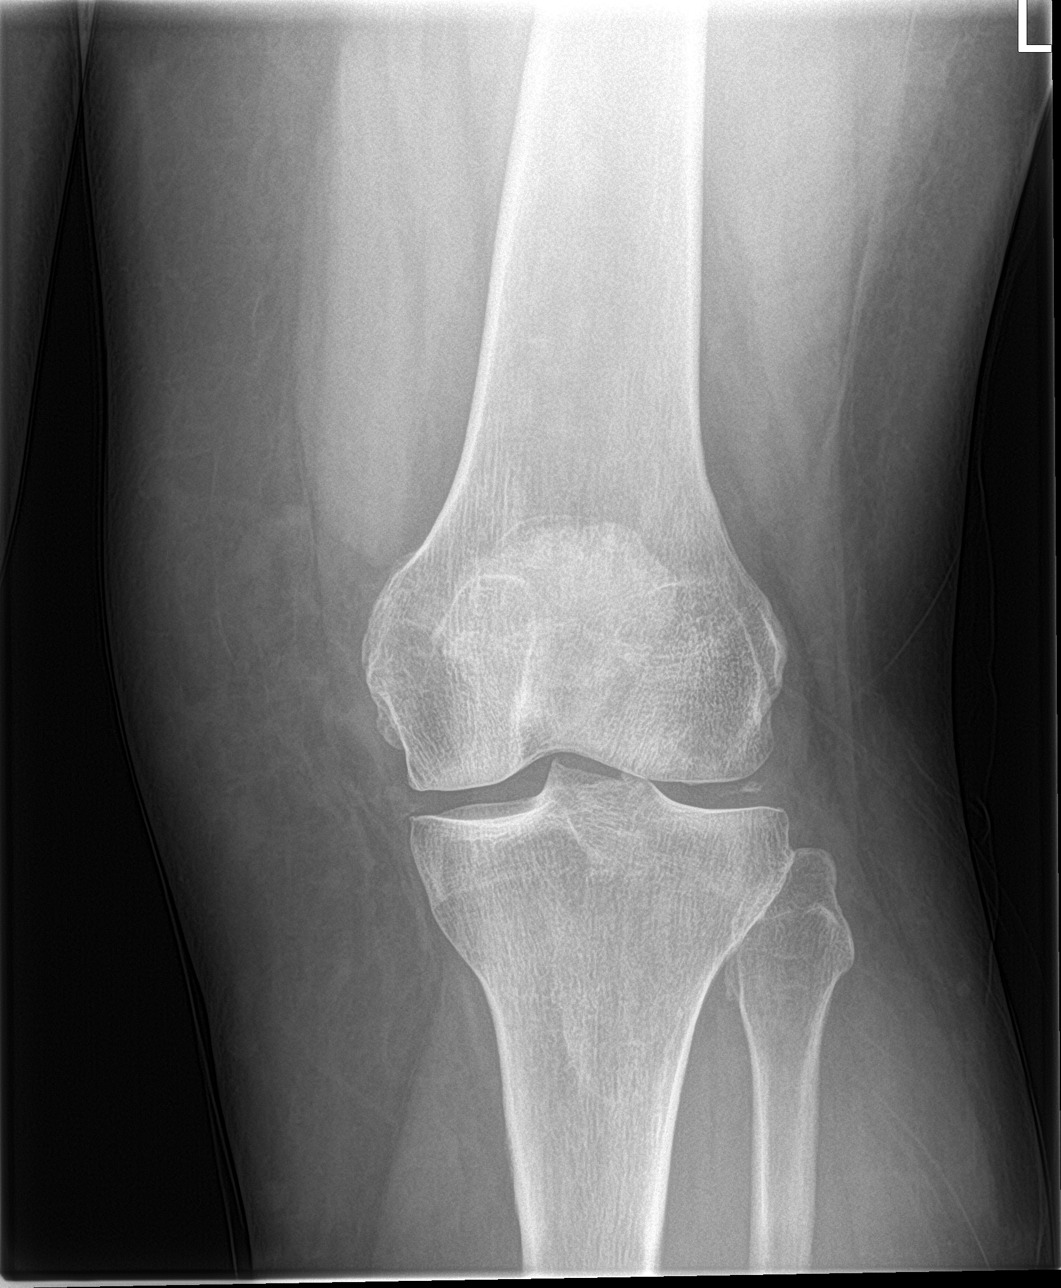

[knee lat]
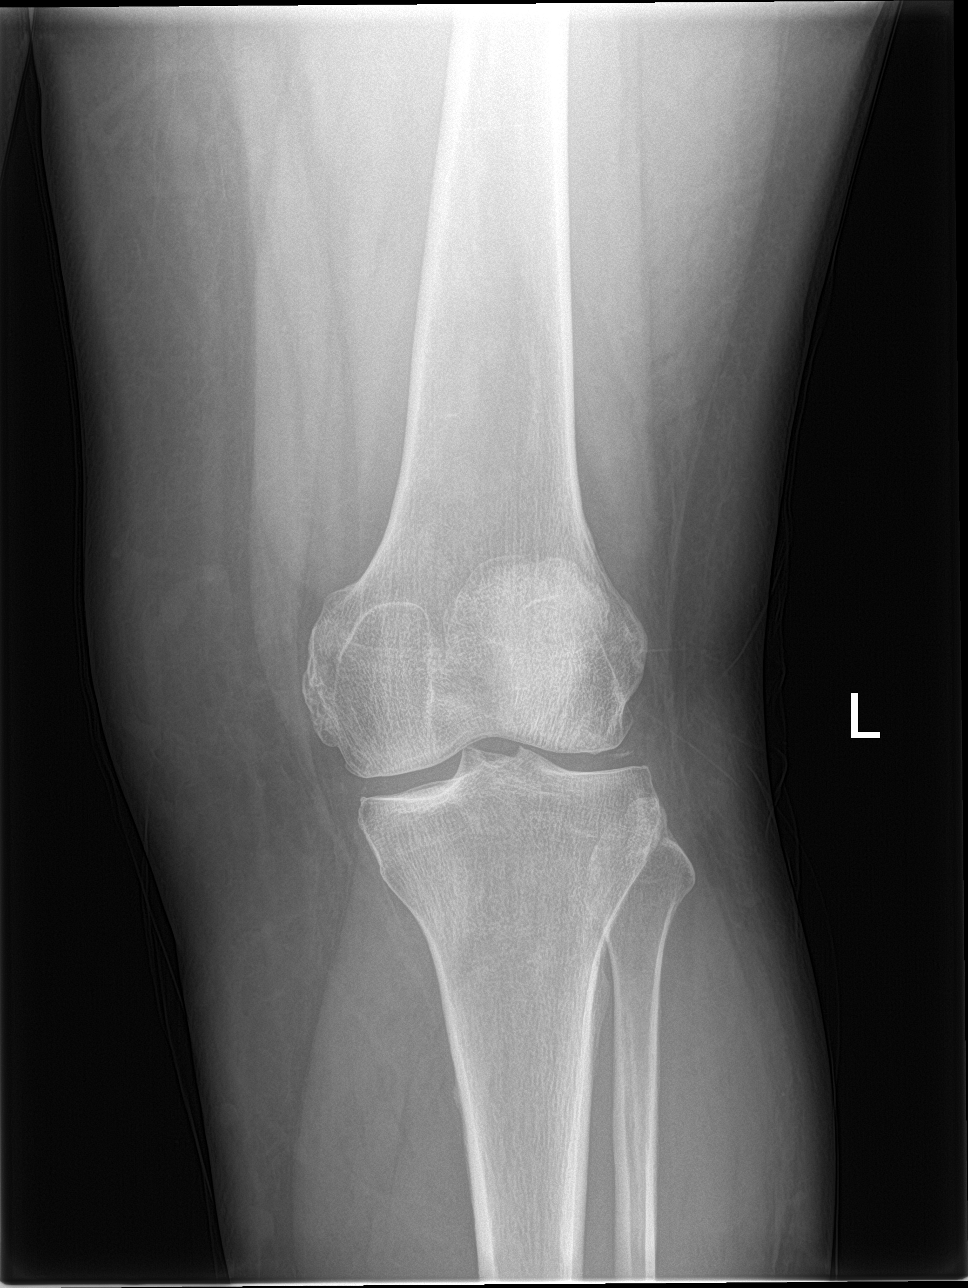

[knee sunrise]
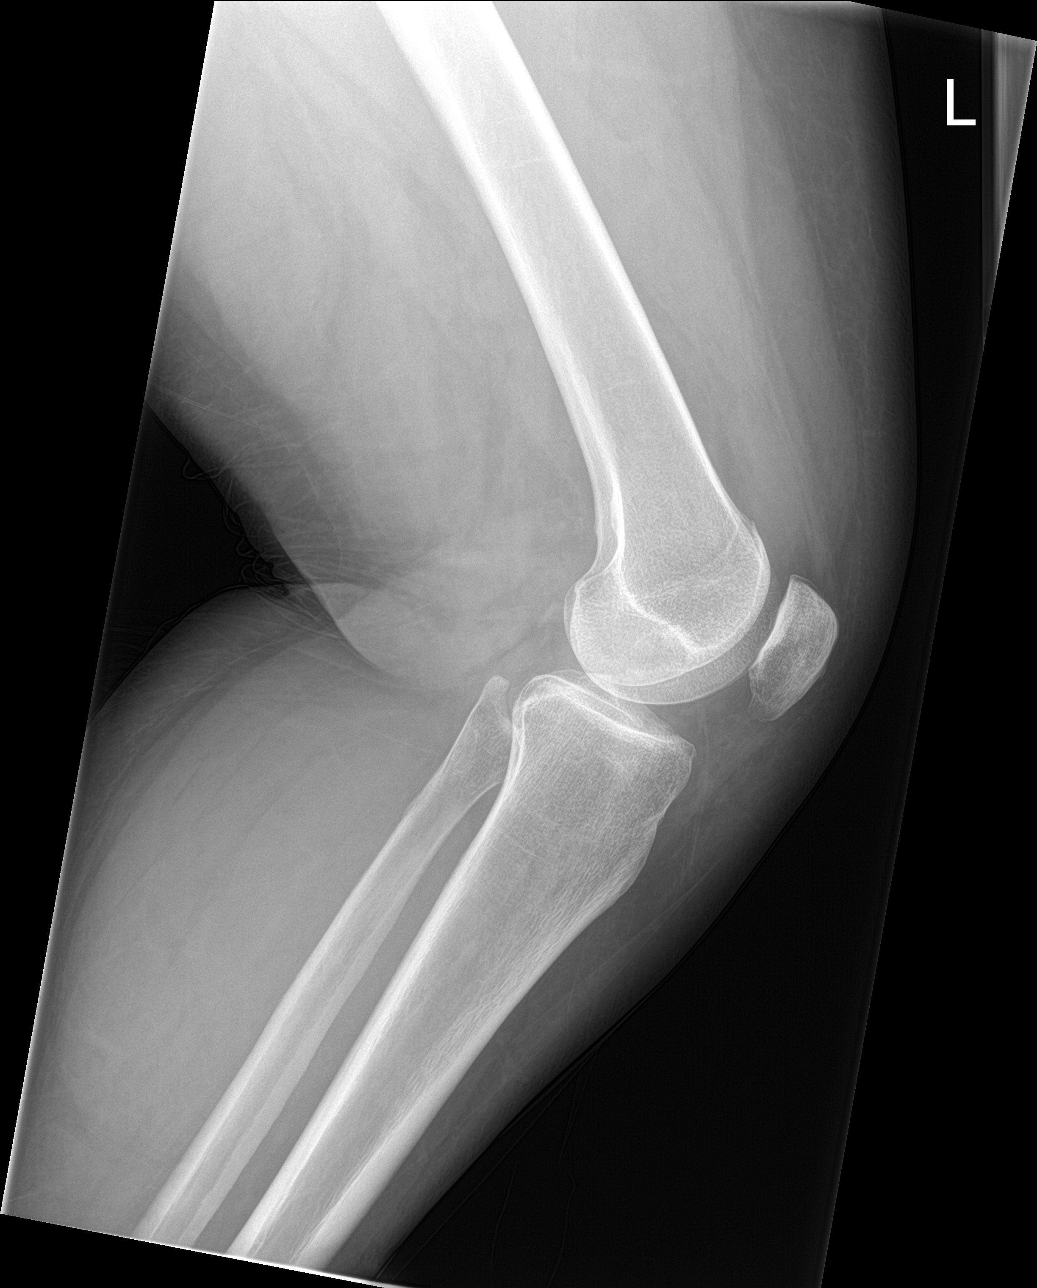

[4 of 4 positions shown; findings below may reference images not displayed]

FINDINGS: There is a small knee joint effusion. There is no joint space
narrowing. The patient shows degenerative chondrocalcinosis of the
menisci, most evident laterally. No other focal lesion.
IMPRESSION: Small joint effusion. No joint space narrowing. Chondrocalcinosis of
the menisci.

## 2017-09-19 ENCOUNTER — Other Ambulatory Visit: Payer: Self-pay | Admitting: Nurse Practitioner

## 2017-09-28 ENCOUNTER — Encounter: Payer: Self-pay | Admitting: Nurse Practitioner

## 2017-09-28 ENCOUNTER — Telehealth: Payer: Self-pay | Admitting: Nurse Practitioner

## 2017-09-28 DIAGNOSIS — E039 Hypothyroidism, unspecified: Secondary | ICD-10-CM

## 2017-09-28 NOTE — Telephone Encounter (Signed)
Noted  

## 2017-09-28 NOTE — Telephone Encounter (Signed)
Labs ordered.

## 2017-09-28 NOTE — Telephone Encounter (Signed)
Please order: TSH, Free T3, Total T3, Reverse T3, Free T4, and TPO antibodies Diagnosis: hypothyroidism

## 2017-09-30 DIAGNOSIS — E039 Hypothyroidism, unspecified: Secondary | ICD-10-CM | POA: Diagnosis not present

## 2017-10-04 LAB — T3, REVERSE: Reverse T3, Serum: 26.7 ng/dL — ABNORMAL HIGH (ref 9.2–24.1)

## 2017-10-04 LAB — T4, FREE: Free T4: 1.28 ng/dL (ref 0.82–1.77)

## 2017-10-04 LAB — TSH: TSH: 3.17 u[IU]/mL (ref 0.450–4.500)

## 2017-10-04 LAB — T3: T3, Total: 121 ng/dL (ref 71–180)

## 2017-10-04 LAB — T3, FREE: T3 FREE: 2.7 pg/mL (ref 2.0–4.4)

## 2017-10-04 LAB — THYROID PEROXIDASE ANTIBODY: Thyroperoxidase Ab SerPl-aCnc: 10 IU/mL (ref 0–34)

## 2017-10-06 ENCOUNTER — Other Ambulatory Visit: Payer: Self-pay | Admitting: Nurse Practitioner

## 2017-11-04 ENCOUNTER — Other Ambulatory Visit: Payer: Self-pay | Admitting: Nurse Practitioner

## 2017-11-04 NOTE — Telephone Encounter (Signed)
Patient last seen 06/24/17

## 2017-11-04 NOTE — Telephone Encounter (Signed)
This +3 refills will need follow-up office visit before further

## 2018-02-04 IMAGING — US ULTRASOUND LEFT BREAST LIMITED
1 series · 10 of 10 positions shown · non-contrast
Comparison: Previous exam(s).

CLINICAL DATA: Screening recall for possible left breast mass seen
on the CC view only.

EXAM:
2D DIGITAL DIAGNOSTIC UNILATERAL LEFT MAMMOGRAM WITH CAD AND ADJUNCT
TOMO
LEFT BREAST ULTRASOUND

[Series 1: ultrasound left breast limited · 0.06mm/px · 10 of 10 slices shown]
[im 1/10]
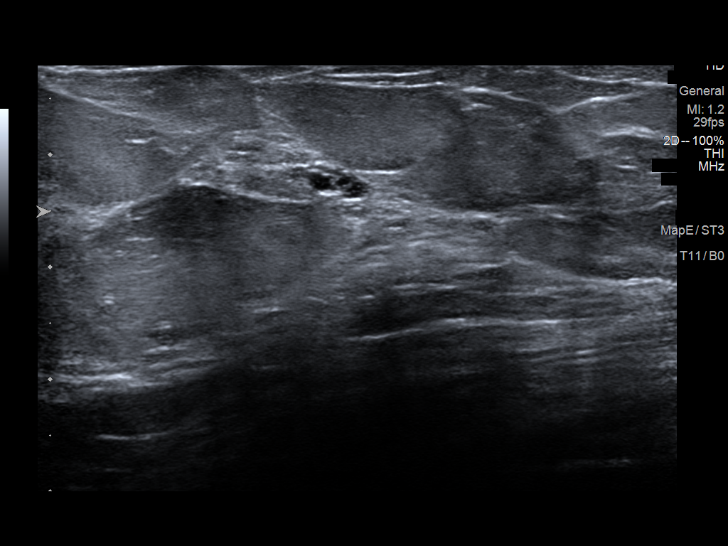
[im 2/10]
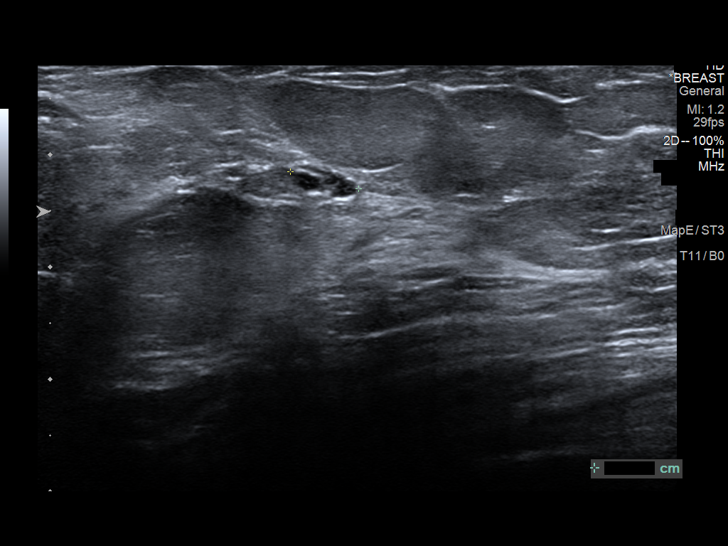
[im 3/10]
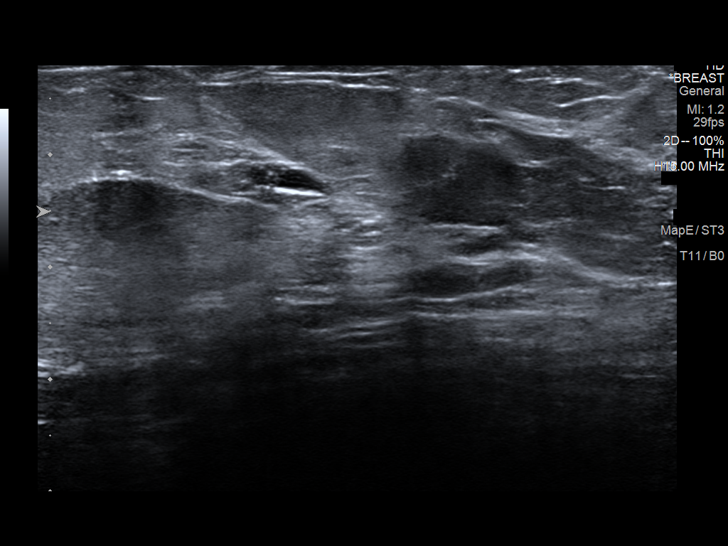
[im 4/10]
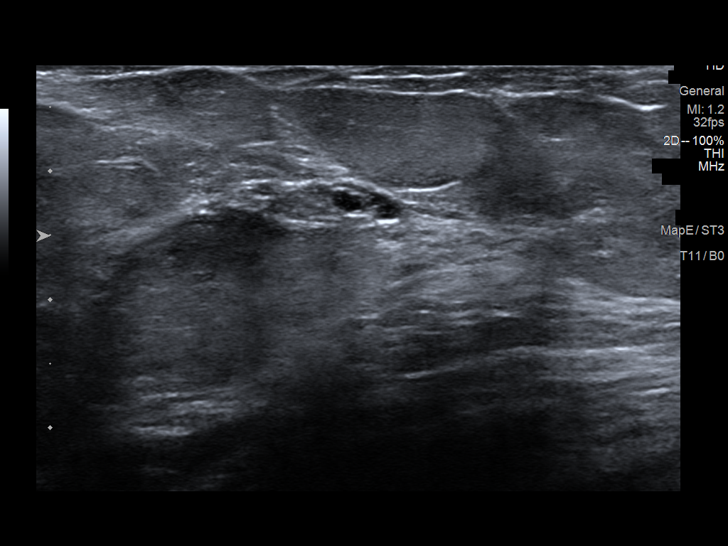
[im 5/10]
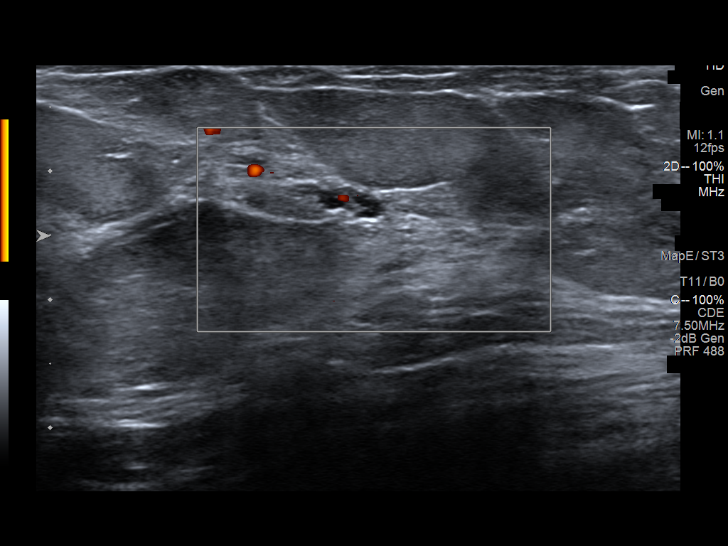
[im 6/10]
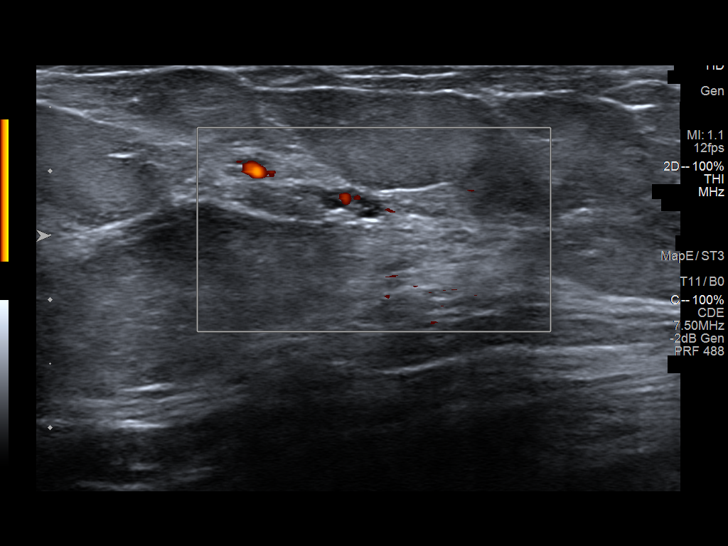
[im 7/10]
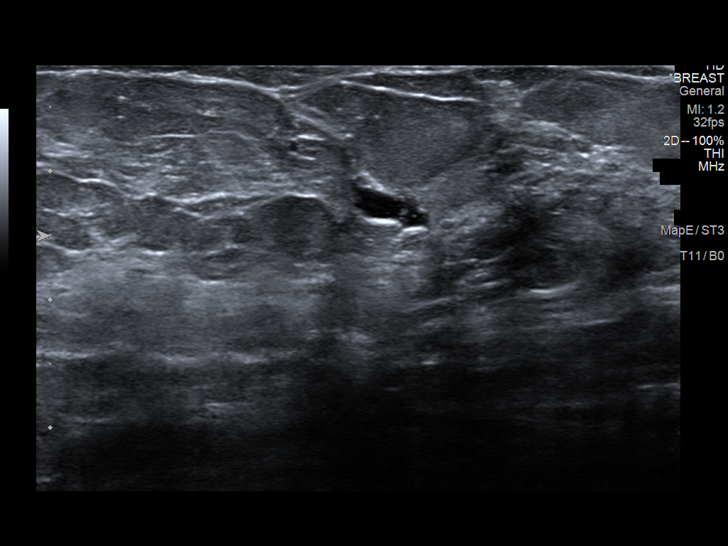
[im 8/10]
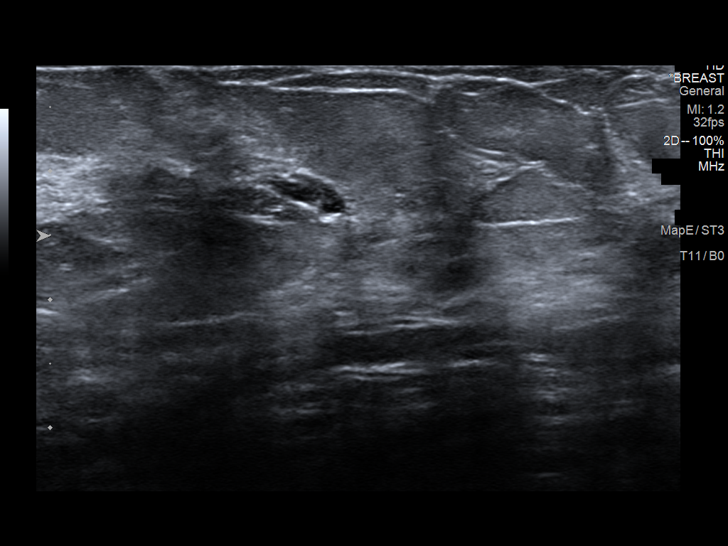
[im 9/10]
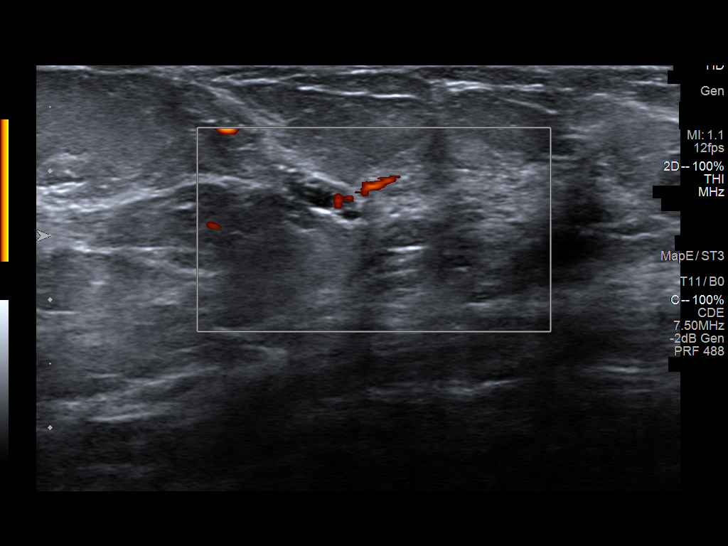
[im 10/10]
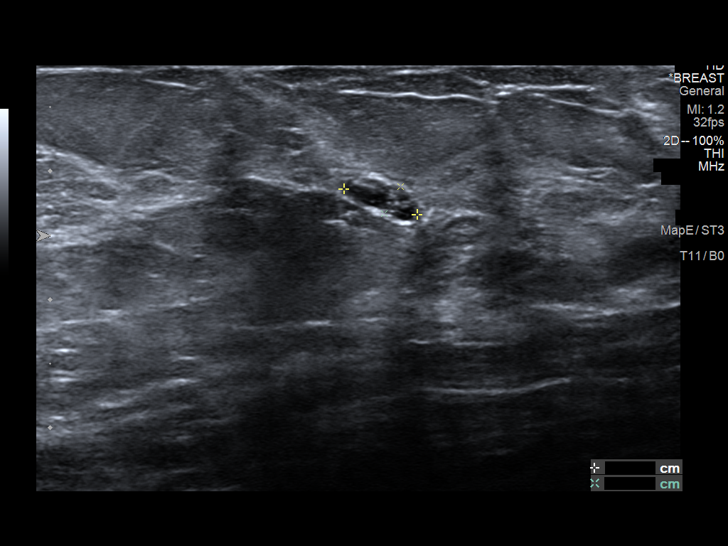

[10 of 10 positions shown; findings below may reference images not displayed]

ACR Breast Density Category b: There are scattered areas of
fibroglandular density.
FINDINGS: Spot compression CC and mL tomograms were performed of the left
breast. There is an oval circumscribed mass in the outer left breast
measuring approximately 6 mm.

Mammographic images were processed with CAD.

Targeted ultrasound of the left breast was performed demonstrating a
cluster of cysts at 3 o'clock 12 cm from the nipple measuring 0.6 x
0.3 by 0.6 cm. This corresponds well with mammography findings.
IMPRESSION: No findings of malignancy in the left breast.

RECOMMENDATION:
Screening mammogram in one year.(Code:[id number])

I have discussed the findings and recommendations with the patient.
Results were also provided in writing at the conclusion of the
visit. If applicable, a reminder letter will be sent to the patient
regarding the next appointment.

BI-RADS CATEGORY  2: Benign.

## 2018-02-21 ENCOUNTER — Encounter: Payer: Self-pay | Admitting: Family Medicine

## 2018-02-21 ENCOUNTER — Ambulatory Visit (INDEPENDENT_AMBULATORY_CARE_PROVIDER_SITE_OTHER): Payer: 59 | Admitting: Family Medicine

## 2018-02-21 VITALS — BP 136/84 | Temp 98.3°F | Ht 65.0 in | Wt 238.0 lb

## 2018-02-21 DIAGNOSIS — N3000 Acute cystitis without hematuria: Secondary | ICD-10-CM

## 2018-02-21 MED ORDER — NITROFURANTOIN MONOHYD MACRO 100 MG PO CAPS
ORAL_CAPSULE | ORAL | 0 refills | Status: DC
Start: 2018-02-21 — End: 2020-01-09

## 2018-02-21 NOTE — Progress Notes (Signed)
   Subjective:    Patient ID: [name], female    DOB: August 29, 1966, 52 y.o.   MRN: [id number]  Urinary Tract Infection   This is a new problem. Episode onset: 2 days. Associated symptoms include frequency and urgency. Associated symptoms comments: Low abdominal pain, pain with urination. Treatments tried: azo. The treatment provided moderate relief.   Sun incr freq   Increased fluid intake , using azo s    No nocturia  A lot of disconfort   Increased frequency.  Positive dysuria.  Diffuse low abdominal discomfort.  Mild low back discomfort.  Progressive over the past week    Got better  Then worse   Review of Systems  Genitourinary: Positive for frequency and urgency.  Impression     Objective:   Physical Exam  Alert vitals stable, NAD. Blood pressure good on repeat. HEENT normal. Lungs clear. Heart regular rate and rhythm.   No vomiting no rash no fever no chills no CVA tenderness      Assessment & Plan:  Urinary tract infection.  Urine shows 6-8 white blood cells per high-power field symptom care discussed warning signs discussed questions answered regarding  Greater than 50% of this 15 minute face to face visit was spent in counseling and discussion and coordination of care regarding the above diagnosis/diagnosies

## 2018-06-26 ENCOUNTER — Other Ambulatory Visit: Payer: Self-pay | Admitting: Nurse Practitioner

## 2018-06-27 ENCOUNTER — Encounter: Payer: Self-pay | Admitting: Family Medicine

## 2018-06-27 MED ORDER — SYNTHROID 100 MCG PO TABS: ORAL_TABLET | ORAL | 4 refills | Status: DC

## 2018-07-14 ENCOUNTER — Other Ambulatory Visit: Payer: Self-pay | Admitting: Family Medicine

## 2018-07-14 NOTE — Telephone Encounter (Signed)
1 refill 

## 2018-09-12 DIAGNOSIS — Z6839 Body mass index (BMI) 39.0-39.9, adult: Secondary | ICD-10-CM | POA: Diagnosis not present

## 2018-09-12 DIAGNOSIS — Z01419 Encounter for gynecological examination (general) (routine) without abnormal findings: Secondary | ICD-10-CM | POA: Diagnosis not present

## 2018-10-19 ENCOUNTER — Other Ambulatory Visit: Payer: Self-pay | Admitting: Family Medicine

## 2018-10-20 NOTE — Telephone Encounter (Signed)
May have 1 refill needs follow-up office visit

## 2018-11-27 ENCOUNTER — Other Ambulatory Visit: Payer: Self-pay | Admitting: Family Medicine

## 2018-11-27 DIAGNOSIS — E039 Hypothyroidism, unspecified: Secondary | ICD-10-CM

## 2018-11-27 MED ORDER — SYNTHROID 100 MCG PO TABS: ORAL_TABLET | ORAL | 0 refills | Status: DC

## 2018-11-27 NOTE — Telephone Encounter (Signed)
Labs ordered, medication sent in and patient is aware. Pt transferred up front to schedule appt

## 2018-11-27 NOTE — Telephone Encounter (Signed)
Call pt tsh plus t 4 and o v early next mo, ref times one when does

## 2018-12-04 ENCOUNTER — Encounter: Payer: Self-pay | Admitting: Family Medicine

## 2018-12-04 ENCOUNTER — Ambulatory Visit (INDEPENDENT_AMBULATORY_CARE_PROVIDER_SITE_OTHER): Payer: 59 | Admitting: Family Medicine

## 2018-12-04 VITALS — BP 128/82 | Temp 98.2°F | Ht 65.0 in | Wt 242.6 lb

## 2018-12-04 DIAGNOSIS — H9202 Otalgia, left ear: Secondary | ICD-10-CM | POA: Diagnosis not present

## 2018-12-04 DIAGNOSIS — J019 Acute sinusitis, unspecified: Secondary | ICD-10-CM | POA: Diagnosis not present

## 2018-12-04 MED ORDER — DOXYCYCLINE HYCLATE 100 MG PO TABS: 100.0000 mg | ORAL_TABLET | Freq: Two times a day (BID) | ORAL | 0 refills | Status: DC

## 2018-12-04 NOTE — Progress Notes (Signed)
   Subjective:    Patient ID: [name], female    DOB: [birth year], 52 y.o.   MRN: [id number]  Otalgia   There is pain in the left ear. This is a new problem. The current episode started in the past 7 days. Pertinent negatives include no coughing, rhinorrhea or sore throat. Treatments tried: decongestants, saline, allergy med.   Reports left ear pain/pressure x 1 week, reports muffled sounds. Reports popping to ears when yawning or swallowing.  Reports some mild congestion, no cough, no fever. No sore throat. Denies recent URI or flying.    Review of Systems  Constitutional: Negative for fever.  HENT: Positive for congestion and ear pain. Negative for rhinorrhea and sore throat.   Respiratory: Negative for cough, shortness of breath and wheezing.        Objective:   Physical Exam Vitals signs and nursing note reviewed.  Constitutional:      General: She is not in acute distress.    Appearance: Normal appearance. She is not toxic-appearing.  HENT:     Head: Normocephalic and atraumatic.     Ears:     Comments: Bilateral TM with loss of light reflex, no erythema noted    Nose:     Right Sinus: No maxillary sinus tenderness or frontal sinus tenderness.     Left Sinus: Maxillary sinus tenderness and frontal sinus tenderness present.     Mouth/Throat:     Mouth: Mucous membranes are moist.     Pharynx: Oropharynx is clear.  Eyes:     General:        Right eye: No discharge.        Left eye: No discharge.  Neck:     Musculoskeletal: Neck supple.  Cardiovascular:     Rate and Rhythm: Normal rate and regular rhythm.     Heart sounds: Normal heart sounds.  Pulmonary:     Effort: Pulmonary effort is normal. No respiratory distress.     Breath sounds: Normal breath sounds.  Lymphadenopathy:     Cervical: No cervical adenopathy.  Skin:    General: Skin is warm and dry.  Neurological:     Mental Status: She is alert and oriented to person, place, and time.  Psychiatric:          Mood and Affect: Mood normal.           Assessment & Plan:  Acute rhinosinusitis  Left ear pain  Will go ahead and treat with doxy given sinus tenderness on exam and loss of light reflex to bilateral ears to cover for possible bacterial infection. Warning signs discussed, f/u if symptoms worsen or fail to improve.  Dr. Mickie Hillier was consulted on this case and is in agreement with the above treatment plan.

## 2018-12-14 DIAGNOSIS — E039 Hypothyroidism, unspecified: Secondary | ICD-10-CM | POA: Diagnosis not present

## 2018-12-15 LAB — TSH: TSH: 2.08 u[IU]/mL (ref 0.450–4.500)

## 2018-12-15 LAB — T4: T4, Total: 9.7 ug/dL (ref 4.5–12.0)

## 2018-12-18 ENCOUNTER — Encounter: Payer: Self-pay | Admitting: Family Medicine

## 2018-12-18 ENCOUNTER — Ambulatory Visit (INDEPENDENT_AMBULATORY_CARE_PROVIDER_SITE_OTHER): Payer: 59 | Admitting: Family Medicine

## 2018-12-18 VITALS — BP 122/78 | Ht 65.0 in | Wt 243.2 lb

## 2018-12-18 DIAGNOSIS — Z23 Encounter for immunization: Secondary | ICD-10-CM

## 2018-12-18 DIAGNOSIS — E038 Other specified hypothyroidism: Secondary | ICD-10-CM

## 2018-12-18 MED ORDER — ALPRAZOLAM 0.5 MG PO TABS: ORAL_TABLET | ORAL | 1 refills | Status: DC

## 2018-12-18 MED ORDER — SYNTHROID 100 MCG PO TABS: ORAL_TABLET | ORAL | 11 refills | Status: DC

## 2018-12-18 NOTE — Progress Notes (Signed)
   Subjective:    Patient ID: [name], female    DOB: 08-30-1966, 53 y.o.   MRN: [id number]  HPI Pt here today for thyroid follow up. Pt had recent blood work. Pt taking 100 mcg Synthroid daily. It mange r jim dandy in [place]    Notes pressure still persisting in the left ear, but bettter   112 often does not work  IT sales professional with thyroid medication.  Does not miss a dose.  Recently had blood work  Results for orders placed or performed in visit on 11/27/18  T4  Result Value Ref Range   T4, Total 9.7 4.5 - 12.0 ug/dL  TSH  Result Value Ref Range   TSH 2.080 0.450 - 4.500 uIU/mL     Review of Systems No headache, no major weight loss or weight gain, no chest pain no back pain abdominal pain no change in bowel habits complete ROS otherwise negative     Objective:   Physical Exam  Alert vitals stable, NAD. Blood pressure good on repeat. HEENT normal. Lungs clear. Heart regular rate and rhythm. Thyroid nonpalpable.  Left TM slight retraction still      Assessment & Plan:  Impression hypothyroidism good control discussed maintain same dose  2.  Resolving respiratory infection discussed to maintain same

## 2019-09-11 ENCOUNTER — Telehealth: Payer: Self-pay | Admitting: *Deleted

## 2019-09-11 NOTE — Telephone Encounter (Signed)
Pt called back and states she did try levothyroxine in the past and it did not work for her. States it did not help her symptoms she was having.

## 2019-09-11 NOTE — Telephone Encounter (Signed)
Received a fax from [employer] [facility] stating coverage for brand synthroid is denied. This medicine is covered only if pt has failed or cannot use levothyroxine. Looks like pt has tried levothyroxine in the past but could not find documentation to why she could not take it. Left message to return call to ask pt.

## 2019-09-12 NOTE — Telephone Encounter (Signed)
Pa redone and got approval from [employer]. Approved from 09/11/2019-09/10/2020. Approval letter sent to medical records to be scanned into chart. Pharm called and notified.

## 2019-09-12 NOTE — Telephone Encounter (Signed)
Pt also notified.

## 2019-10-03 ENCOUNTER — Other Ambulatory Visit: Payer: Self-pay | Admitting: Family Medicine

## 2019-10-04 NOTE — Telephone Encounter (Signed)
Ok plus two ref 

## 2019-10-05 ENCOUNTER — Encounter: Payer: Self-pay | Admitting: Family Medicine

## 2019-10-05 NOTE — Telephone Encounter (Signed)
I called pharm because the script was faxed over yesterday and they said they did not receive it but they are out of xanax anyways. I called and discussed with pt and she wanted it sent to [facility]. They told me the same thing. They said all pharmacies in this area are having the same problem. I called pt and got the script that dr Richardson Landry signed that was faxed and put it up front for pt to pick up and carry around to see if she can find a pharm to fill it.

## 2019-10-10 NOTE — Telephone Encounter (Signed)
error 

## 2019-12-31 ENCOUNTER — Other Ambulatory Visit: Payer: Self-pay | Admitting: Family Medicine

## 2019-12-31 DIAGNOSIS — E038 Other specified hypothyroidism: Secondary | ICD-10-CM

## 2019-12-31 DIAGNOSIS — Z79899 Other long term (current) drug therapy: Secondary | ICD-10-CM

## 2019-12-31 DIAGNOSIS — Z13220 Encounter for screening for lipoid disorders: Secondary | ICD-10-CM

## 2019-12-31 NOTE — Telephone Encounter (Signed)
Left message to schedule appointment

## 2019-12-31 NOTE — Telephone Encounter (Signed)
Please contact patient to set up appt; then may route back to nurses. Thank you  

## 2020-01-02 NOTE — Telephone Encounter (Signed)
Lab orders placed and pt is aware 

## 2020-01-02 NOTE — Telephone Encounter (Signed)
Lip liv m7 tsh t4

## 2020-01-02 NOTE — Telephone Encounter (Signed)
Scheduled 1/27. Pt also wants to know if she needs to get lab work done.

## 2020-01-05 LAB — HEPATIC FUNCTION PANEL
ALT: 17 IU/L (ref 0–32)
AST: 19 IU/L (ref 0–40)
Albumin: 4.2 g/dL (ref 3.8–4.9)
Alkaline Phosphatase: 124 IU/L — ABNORMAL HIGH (ref 39–117)
Bilirubin Total: 0.9 mg/dL (ref 0.0–1.2)
Bilirubin, Direct: 0.18 mg/dL (ref 0.00–0.40)
Total Protein: 7.2 g/dL (ref 6.0–8.5)

## 2020-01-05 LAB — BASIC METABOLIC PANEL
BUN/Creatinine Ratio: 13 (ref 9–23)
BUN: 15 mg/dL (ref 6–24)
CO2: 24 mmol/L (ref 20–29)
Calcium: 9.4 mg/dL (ref 8.7–10.2)
Chloride: 103 mmol/L (ref 96–106)
Creatinine, Ser: 1.18 mg/dL — ABNORMAL HIGH (ref 0.57–1.00)
GFR calc Af Amer: 61 mL/min/{1.73_m2} (ref >59)
GFR calc non Af Amer: 53 mL/min/{1.73_m2} — ABNORMAL LOW (ref >59)
Glucose: 92 mg/dL (ref 65–99)
Potassium: 5.1 mmol/L (ref 3.5–5.2)
Sodium: 140 mmol/L (ref 134–144)

## 2020-01-05 LAB — LIPID PANEL
Chol/HDL Ratio: 4.1 ratio (ref 0.0–4.4)
Cholesterol, Total: 224 mg/dL — ABNORMAL HIGH (ref 100–199)
HDL: 54 mg/dL (ref >39)
LDL Chol Calc (NIH): 143 mg/dL — ABNORMAL HIGH (ref 0–99)
Triglycerides: 153 mg/dL — ABNORMAL HIGH (ref 0–149)
VLDL Cholesterol Cal: 27 mg/dL (ref 5–40)

## 2020-01-05 LAB — TSH: TSH: 2.67 u[IU]/mL (ref 0.450–4.500)

## 2020-01-05 LAB — T4: T4, Total: 8.7 ug/dL (ref 4.5–12.0)

## 2020-01-09 ENCOUNTER — Ambulatory Visit (INDEPENDENT_AMBULATORY_CARE_PROVIDER_SITE_OTHER): Payer: No Typology Code available for payment source | Admitting: Family Medicine

## 2020-01-09 ENCOUNTER — Other Ambulatory Visit: Payer: Self-pay

## 2020-01-09 DIAGNOSIS — E038 Other specified hypothyroidism: Secondary | ICD-10-CM

## 2020-01-09 MED ORDER — ALPRAZOLAM 0.5 MG PO TABS: ORAL_TABLET | ORAL | 2 refills | Status: DC

## 2020-01-09 MED ORDER — SYNTHROID 100 MCG PO TABS: ORAL_TABLET | ORAL | 3 refills | Status: DC

## 2020-01-09 NOTE — Progress Notes (Signed)
Subjective:  Audio only  Patient ID: [name], female    DOB: 26-Jun-1966, 54 y.o.   MRN: [id number]  HPI med check up. Hypothyroid. Takes synthroid 100 mcg daily.   Takes xanax one to two tablets a month around her cycle.   Pt states no problems or concerns today.   Virtual Visit via Telephone Note  I connected with [name] on 01/09/20 at  9:00 AM EST by telephone and verified that I am speaking with the correct person using two identifiers.  Location: Patient: home Provider: office   I discussed the limitations, risks, security and privacy concerns of performing an evaluation and management service by telephone and the availability of in person appointments. I also discussed with the patient that there may be a patient responsible charge related to this service. The patient expressed understanding and agreed to proceed.   History of Present Illness:    Observations/Objective:   Assessment and Plan:   Follow Up Instructions:    I discussed the assessment and treatment plan with the patient. The patient was provided an opportunity to ask questions and all were answered. The patient agreed with the plan and demonstrated an understanding of the instructions.   The patient was advised to call back or seek an in-person evaluation if the symptoms worsen or if the condition fails to improve as anticipated.  I provided20 minutes of non-face-to-face time during this encounter.  Results for orders placed or performed in visit on 12/31/19  Lipid Profile  Result Value Ref Range   Cholesterol, Total 224 (H) 100 - 199 mg/dL   Triglycerides 153 (H) 0 - 149 mg/dL   HDL 54 >39 mg/dL   VLDL Cholesterol Cal 27 5 - 40 mg/dL   LDL Chol Calc (NIH) 143 (H) 0 - 99 mg/dL   Chol/HDL Ratio 4.1 0.0 - 4.4 ratio  Hepatic function panel  Result Value Ref Range   Total Protein 7.2 6.0 - 8.5 g/dL   Albumin 4.2 3.8 - 4.9 g/dL   Bilirubin Total 0.9 0.0 - 1.2 mg/dL   Bilirubin,  Direct 0.18 0.00 - 0.40 mg/dL   Alkaline Phosphatase 124 (H) 39 - 117 IU/L   AST 19 0 - 40 IU/L   ALT 17 0 - 32 IU/L  Basic Metabolic Panel (BMET)  Result Value Ref Range   Glucose 92 65 - 99 mg/dL   BUN 15 6 - 24 mg/dL   Creatinine, Ser 1.18 (H) 0.57 - 1.00 mg/dL   GFR calc non Af Amer 53 (L) >59 mL/min/1.73   GFR calc Af Amer 61 >59 mL/min/1.73   BUN/Creatinine Ratio 13 9 - 23   Sodium 140 134 - 144 mmol/L   Potassium 5.1 3.5 - 5.2 mmol/L   Chloride 103 96 - 106 mmol/L   CO2 24 20 - 29 mmol/L   Calcium 9.4 8.7 - 10.2 mg/dL  TSH  Result Value Ref Range   TSH 2.670 0.450 - 4.500 uIU/mL  T4  Result Value Ref Range   T4, Total 8.7 4.5 - 12.0 ug/dL    Patient compliant with thyroid medication.  No symptoms of high or low thyroid.  Walking quite a bit.    Review of Systems No headache, no major weight loss or weight gain, no chest pain no back pain abdominal pain no change in bowel habits complete ROS otherwise negative     Objective:   Physical Exam Virtual       Assessment & Plan:  Impression hypothyroidism.  Good control discussed 1 years worth of medication  2.  Episodic anxiety.  Xanax helps will maintain refill  3.  Hyperlipidemia LDL diet encouraged.  Excellent HDL is on no medications at this time  4.  Medications refilled diet exercise discussed

## 2020-01-16 ENCOUNTER — Encounter: Payer: Self-pay | Admitting: Family Medicine

## 2020-03-07 ENCOUNTER — Ambulatory Visit: Payer: Self-pay | Attending: Internal Medicine

## 2020-03-07 DIAGNOSIS — Z23 Encounter for immunization: Secondary | ICD-10-CM

## 2020-03-07 NOTE — Progress Notes (Signed)
   Covid-19 Vaccination Clinic  Name:  [name]    MRN: [id number] DOB: August 13, 1966  03/07/2020  [name] was observed post Covid-19 immunization for 30 minutes based on pre-vaccination screening without incident. She was provided with Vaccine Information Sheet and instruction to access the V-Safe system.   [name] was instructed to call 911 with any severe reactions post vaccine: Marland Kitchen Difficulty breathing  . Swelling of face and throat  . A fast heartbeat  . A bad rash all over body  . Dizziness and weakness   Immunizations Administered    Name Date Dose VIS Date Route   Moderna COVID-19 Vaccine 03/07/2020  1:00 PM 0.5 mL 11/13/2019 Intramuscular   Manufacturer: Moderna   Lot: [id number]   [id number]

## 2020-04-09 ENCOUNTER — Ambulatory Visit: Payer: Self-pay | Attending: Internal Medicine

## 2020-04-09 DIAGNOSIS — Z23 Encounter for immunization: Secondary | ICD-10-CM

## 2020-04-09 NOTE — Progress Notes (Signed)
   Covid-19 Vaccination Clinic  Name:  [name]    MRN: [id number] DOB: Feb 28, 1966  04/09/2020  [name] was observed post Covid-19 immunization for 15 minutes without incident. She was provided with Vaccine Information Sheet and instruction to access the V-Safe system.   [name] was instructed to call 911 with any severe reactions post vaccine: Marland Kitchen Difficulty breathing  . Swelling of face and throat  . A fast heartbeat  . A bad rash all over body  . Dizziness and weakness   Immunizations Administered    Name Date Dose VIS Date Route   Moderna COVID-19 Vaccine 04/09/2020 10:39 AM 0.5 mL 11/2019 Intramuscular   Manufacturer: Moderna   Lot: [id number]   [id number]

## 2020-05-08 ENCOUNTER — Encounter: Payer: Self-pay | Admitting: Family Medicine

## 2020-07-09 ENCOUNTER — Other Ambulatory Visit: Payer: Self-pay | Admitting: *Deleted

## 2020-07-09 MED ORDER — ALPRAZOLAM 0.5 MG PO TABS: ORAL_TABLET | ORAL | 0 refills | Status: DC

## 2020-07-09 NOTE — Telephone Encounter (Signed)
lvm to schedule appt and let pt know to have lab work done before appt.

## 2020-07-09 NOTE — Telephone Encounter (Signed)
Scheduled pt for 8/20.

## 2020-07-14 DIAGNOSIS — G43909 Migraine, unspecified, not intractable, without status migrainosus: Secondary | ICD-10-CM | POA: Insufficient documentation

## 2020-07-14 DIAGNOSIS — N390 Urinary tract infection, site not specified: Secondary | ICD-10-CM | POA: Insufficient documentation

## 2020-07-14 DIAGNOSIS — D649 Anemia, unspecified: Secondary | ICD-10-CM | POA: Insufficient documentation

## 2020-07-14 DIAGNOSIS — K829 Disease of gallbladder, unspecified: Secondary | ICD-10-CM | POA: Insufficient documentation

## 2020-08-01 ENCOUNTER — Ambulatory Visit: Payer: No Typology Code available for payment source | Admitting: Family Medicine

## 2020-08-01 ENCOUNTER — Telehealth: Payer: Self-pay | Admitting: Family Medicine

## 2020-08-01 NOTE — Telephone Encounter (Signed)
Patient should not refill not need refill yet and PA has been done for the Brand name medication.

## 2020-08-01 NOTE — Telephone Encounter (Signed)
Pt is requesting refill on SYNTHROID 100 MCG tablet  Pt canceled her est care appt for 8/20. When pt called today for request I informed pt she would need to schedule appt to est care with Dr. Lovena Le. Pt stated this is an existing script from Dr. Richardson Landry. I told pt I understood but he is no longer here and she needs to see Dr. Lovena Le. Pt didn't schedule appt due to not knowing her work schedule.   [id number] - [place], [address]

## 2020-08-30 ENCOUNTER — Encounter: Payer: Self-pay | Admitting: Nurse Practitioner

## 2021-02-04 ENCOUNTER — Telehealth: Payer: Self-pay

## 2021-02-04 NOTE — Telephone Encounter (Signed)
Last labs jan 2021. Lipid, liver, bmp, tsh, t4. Also See message below.

## 2021-02-04 NOTE — Telephone Encounter (Signed)
Pt needs refill on SYNTHROID 100 MCG tablet [id number] - [place] [place] [address] Pt only has one left I have scheduled Med Check appt 03/04 at 1:00 pt also needs blood work ordered.  Pt call back [phone]

## 2021-02-06 ENCOUNTER — Telehealth: Payer: Self-pay | Admitting: Family Medicine

## 2021-02-06 ENCOUNTER — Other Ambulatory Visit: Payer: Self-pay | Admitting: *Deleted

## 2021-02-06 DIAGNOSIS — Z13220 Encounter for screening for lipoid disorders: Secondary | ICD-10-CM

## 2021-02-06 DIAGNOSIS — E039 Hypothyroidism, unspecified: Secondary | ICD-10-CM

## 2021-02-06 DIAGNOSIS — Z79899 Other long term (current) drug therapy: Secondary | ICD-10-CM

## 2021-02-06 MED ORDER — SYNTHROID 100 MCG PO TABS: ORAL_TABLET | ORAL | 1 refills | Status: DC

## 2021-02-06 NOTE — Telephone Encounter (Signed)
Lab Results  Component Value Date   TSH 2.670 01/04/2020   Lab Results  Component Value Date   NA 140 01/04/2020   K 5.1 01/04/2020   CL 103 01/04/2020   CO2 24 01/04/2020      Nurse pls order tsh, cmp, lipids, and cbc.  Pls give 30 day supply and 1 refill. Have pt appt in 30-40 days.   Thx.

## 2021-02-06 NOTE — Telephone Encounter (Signed)
Patient is needing labs done for appointment on 3/4 and also needing refill on synthroid 100 mg she is completely out [facility]

## 2021-02-06 NOTE — Telephone Encounter (Signed)
Yes pls order those labs and give 30 days with 1 refill. Thx. Dr. Lovena Le

## 2021-02-06 NOTE — Telephone Encounter (Signed)
Orders put in and pt was notified. Refill sent to pharm.

## 2021-02-06 NOTE — Telephone Encounter (Signed)
Orders put in and refill sent to pharm. Pt notified.

## 2021-02-10 LAB — HEPATIC FUNCTION PANEL
ALT: 18 IU/L (ref 0–32)
AST: 18 IU/L (ref 0–40)
Albumin: 4.4 g/dL (ref 3.8–4.9)
Alkaline Phosphatase: 121 IU/L (ref 44–121)
Bilirubin Total: 0.8 mg/dL (ref 0.0–1.2)
Bilirubin, Direct: 0.19 mg/dL (ref 0.00–0.40)
Total Protein: 7.1 g/dL (ref 6.0–8.5)

## 2021-02-10 LAB — BASIC METABOLIC PANEL
BUN/Creatinine Ratio: 11 (ref 9–23)
BUN: 11 mg/dL (ref 6–24)
CO2: 26 mmol/L (ref 20–29)
Calcium: 9.7 mg/dL (ref 8.7–10.2)
Chloride: 104 mmol/L (ref 96–106)
Creatinine, Ser: 1 mg/dL (ref 0.57–1.00)
Glucose: 92 mg/dL (ref 65–99)
Potassium: 4.7 mmol/L (ref 3.5–5.2)
Sodium: 143 mmol/L (ref 134–144)
eGFR: 67 mL/min/{1.73_m2} (ref >59)

## 2021-02-10 LAB — LIPID PANEL
Chol/HDL Ratio: 4.4 ratio (ref 0.0–4.4)
Cholesterol, Total: 253 mg/dL — ABNORMAL HIGH (ref 100–199)
HDL: 57 mg/dL (ref >39)
LDL Chol Calc (NIH): 163 mg/dL — ABNORMAL HIGH (ref 0–99)
Triglycerides: 181 mg/dL — ABNORMAL HIGH (ref 0–149)
VLDL Cholesterol Cal: 33 mg/dL (ref 5–40)

## 2021-02-10 LAB — TSH: TSH: 4.55 u[IU]/mL — ABNORMAL HIGH (ref 0.450–4.500)

## 2021-02-10 LAB — T4: T4, Total: 8.8 ug/dL (ref 4.5–12.0)

## 2021-02-13 ENCOUNTER — Encounter: Payer: Self-pay | Admitting: Family Medicine

## 2021-02-13 ENCOUNTER — Ambulatory Visit (INDEPENDENT_AMBULATORY_CARE_PROVIDER_SITE_OTHER): Payer: No Typology Code available for payment source | Admitting: Family Medicine

## 2021-02-13 ENCOUNTER — Other Ambulatory Visit: Payer: Self-pay

## 2021-02-13 VITALS — BP 128/86 | HR 88 | Ht 65.0 in | Wt 242.0 lb

## 2021-02-13 DIAGNOSIS — E038 Other specified hypothyroidism: Secondary | ICD-10-CM | POA: Diagnosis not present

## 2021-02-13 MED ORDER — SYNTHROID 100 MCG PO TABS: ORAL_TABLET | ORAL | 2 refills | Status: DC

## 2021-02-13 NOTE — Patient Instructions (Signed)
Follow up to lab in 6-8 wks for tsh.

## 2021-02-13 NOTE — Progress Notes (Signed)
Patient ID: [name], female    DOB: 08-04-1966, 55 y.o.   MRN: [id number]   Chief Complaint  Patient presents with  . Hypothyroidism   Subjective:    HPI  Cc- thyroid med check up. No concerns.   Hypothyroidism. Takes levothyroxine.  Pt ran out for about 1-2 wks prior to the labs.   Takes xanax prn for anxiety, seeing gyn for this.   Not ill from covid in the past years.   TSH- was off the meds for a few days. Been doing well with 192mcg and tried higher dose in past and had palpitations. hair loss improving with vitamins.  Cholesterol- elevated.   Exercising- 5x per week. Doing it for a year.  Lost about 10 lbs over past year.  Wt Readings from Last 3 Encounters:  02/13/21 242 lb (109.8 kg)  12/18/18 243 lb 3.2 oz (110.3 kg)  12/04/18 242 lb 9.6 oz (110 kg)    Pt stating she is on Gluten free and plant based diet.  Pt has h/o pmdd- pt seeing gyn and using xanax prn from gyn. Dr. Nori Riis, gyn- in [place].  Last normal, per pt had one in 2021 and had mammo, normal.  Had colonoscopy at 46, normal per pt.  Not sure when needing to return.  Discussed her weight concerns. Not wanting to take a med at this time.  Feeling frustrated that not losing much weight.  Medical History [name] has a past medical history of Hypothyroidism and S/P colonoscopy (Aug 2011).   Outpatient Encounter Medications as of 02/13/2021  Medication Sig  . ALPRAZolam (XANAX) 0.5 MG tablet TAKE 1/2 TO 1 TABLET BY MOUTH TWICE DAILY AS NEEDED  . IRON PO Take by mouth. OTC one a day  . Multiple Vitamin (MULTIVITAMIN) tablet Take 1 tablet by mouth daily.  . [DISCONTINUED] SYNTHROID 100 MCG tablet TAKE 1 TABLET BY MOUTH EVERY DAY  . SYNTHROID 100 MCG tablet TAKE 1 TABLET BY MOUTH EVERY DAY   No facility-administered encounter medications on file as of 02/13/2021.     Review of Systems  Constitutional: Negative for chills and fever.  HENT: Negative for congestion, rhinorrhea and sore  throat.   Respiratory: Negative for cough, shortness of breath and wheezing.   Cardiovascular: Negative for chest pain and leg swelling.  Gastrointestinal: Negative for abdominal pain, diarrhea, nausea and vomiting.  Endocrine: Negative for cold intolerance and heat intolerance.  Genitourinary: Negative for dysuria and frequency.  Musculoskeletal: Negative for arthralgias and back pain.  Skin: Negative for rash.  Neurological: Negative for dizziness, weakness and headaches.     Vitals BP 128/86   Pulse 88   Ht 5\' 5"  (1.651 m)   Wt 242 lb (109.8 kg)   SpO2 99%   BMI 40.27 kg/m   Objective:   Physical Exam Vitals and nursing note reviewed.  Constitutional:      General: She is not in acute distress.    Appearance: Normal appearance. She is obese. She is not ill-appearing.  HENT:     Head: Normocephalic and atraumatic.     Nose: Nose normal.     Mouth/Throat:     Mouth: Mucous membranes are moist.     Pharynx: Oropharynx is clear.  Eyes:     Extraocular Movements: Extraocular movements intact.     Conjunctiva/sclera: Conjunctivae normal.     Pupils: Pupils are equal, round, and reactive to light.  Cardiovascular:     Rate and Rhythm: Normal rate and regular  rhythm.     Pulses: Normal pulses.     Heart sounds: Normal heart sounds. No murmur heard.   Pulmonary:     Effort: Pulmonary effort is normal. No respiratory distress.     Breath sounds: Normal breath sounds. No wheezing, rhonchi or rales.  Musculoskeletal:        General: Normal range of motion.     Right lower leg: No edema.     Left lower leg: No edema.  Skin:    General: Skin is warm and dry.     Findings: No lesion or rash.  Neurological:     General: No focal deficit present.     Mental Status: She is alert and oriented to person, place, and time.  Psychiatric:        Mood and Affect: Mood normal.        Behavior: Behavior normal.     Assessment and Plan   1. Other specified hypothyroidism -  TSH  2. Morbid obesity ([place])   Hypothyroid- suboptimal. Pt ran out of meds for a couple weeks.  Refilled meds and pt to f/u in 6-8 wks for tsh lab repeat.  Pt to call about colonoscopy.  Reviewed diet and counting calories and watching portions.  Cont to exercise. Pt declining medications or f/u with medical weight loss at this time.  Return in about 1 year (around 02/13/2022) for thyroid.  02/13/2021

## 2021-07-22 ENCOUNTER — Emergency Department (HOSPITAL_COMMUNITY)
Admission: EM | Admit: 2021-07-22 | Discharge: 2021-07-22 | Disposition: A | Discharge status: Home / Self Care | Payer: No Typology Code available for payment source | Attending: Emergency Medicine | Admitting: Emergency Medicine

## 2021-07-22 ENCOUNTER — Emergency Department (HOSPITAL_COMMUNITY): Payer: No Typology Code available for payment source

## 2021-07-22 ENCOUNTER — Encounter (HOSPITAL_COMMUNITY): Payer: Self-pay

## 2021-07-22 ENCOUNTER — Other Ambulatory Visit: Payer: Self-pay

## 2021-07-22 DIAGNOSIS — K529 Noninfective gastroenteritis and colitis, unspecified: Secondary | ICD-10-CM | POA: Diagnosis not present

## 2021-07-22 DIAGNOSIS — Z79899 Other long term (current) drug therapy: Secondary | ICD-10-CM | POA: Insufficient documentation

## 2021-07-22 DIAGNOSIS — E039 Hypothyroidism, unspecified: Secondary | ICD-10-CM | POA: Diagnosis not present

## 2021-07-22 DIAGNOSIS — K219 Gastro-esophageal reflux disease without esophagitis: Secondary | ICD-10-CM | POA: Diagnosis not present

## 2021-07-22 DIAGNOSIS — R1032 Left lower quadrant pain: Secondary | ICD-10-CM | POA: Diagnosis present

## 2021-07-22 HISTORY — DX: Irritable bowel syndrome, unspecified: K58.9

## 2021-07-22 LAB — COMPREHENSIVE METABOLIC PANEL
ALT: 39 U/L (ref 0–44)
AST: 37 U/L (ref 15–41)
Albumin: 3.7 g/dL (ref 3.5–5.0)
Alkaline Phosphatase: 119 U/L (ref 38–126)
Anion gap: 6 (ref 5–15)
BUN: 10 mg/dL (ref 6–20)
CO2: 29 mmol/L (ref 22–32)
Calcium: 8.9 mg/dL (ref 8.9–10.3)
Chloride: 103 mmol/L (ref 98–111)
Creatinine, Ser: 0.9 mg/dL (ref 0.44–1.00)
GFR, Estimated: >60 mL/min (ref >60)
Glucose, Bld: 129 mg/dL — ABNORMAL HIGH (ref 70–99)
Potassium: 3.5 mmol/L (ref 3.5–5.1)
Sodium: 138 mmol/L (ref 135–145)
Total Bilirubin: 1.3 mg/dL — ABNORMAL HIGH (ref 0.3–1.2)
Total Protein: 7.6 g/dL (ref 6.5–8.1)

## 2021-07-22 LAB — CBC WITH DIFFERENTIAL/PLATELET
Abs Immature Granulocytes: 0.03 10*3/uL (ref 0.00–0.07)
Basophils Absolute: 0 10*3/uL (ref 0.0–0.1)
Basophils Relative: 0 %
Eosinophils Absolute: 0.2 10*3/uL (ref 0.0–0.5)
Eosinophils Relative: 2 %
HCT: 40.6 % (ref 36.0–46.0)
Hemoglobin: 12.8 g/dL (ref 12.0–15.0)
Immature Granulocytes: 0 %
Lymphocytes Relative: 9 %
Lymphs Abs: 0.9 10*3/uL (ref 0.7–4.0)
MCH: 26.1 pg (ref 26.0–34.0)
MCHC: 31.5 g/dL (ref 30.0–36.0)
MCV: 82.7 fL (ref 80.0–100.0)
Monocytes Absolute: 0.7 10*3/uL (ref 0.1–1.0)
Monocytes Relative: 6 %
Neutro Abs: 8.5 10*3/uL — ABNORMAL HIGH (ref 1.7–7.7)
Neutrophils Relative %: 83 %
Platelets: 304 10*3/uL (ref 150–400)
RBC: 4.91 MIL/uL (ref 3.87–5.11)
RDW: 14.3 % (ref 11.5–15.5)
WBC: 10.3 10*3/uL (ref 4.0–10.5)
nRBC: 0 % (ref 0.0–0.2)

## 2021-07-22 LAB — LIPASE, BLOOD: Lipase: 21 U/L (ref 11–51)

## 2021-07-22 LAB — POC OCCULT BLOOD, ED: Fecal Occult Bld: POSITIVE — AB

## 2021-07-22 MED ORDER — SODIUM CHLORIDE 0.9 % IV SOLN: INTRAVENOUS | Status: DC

## 2021-07-22 MED ORDER — TRAMADOL HCL 50 MG PO TABS: 50.0000 mg | ORAL_TABLET | Freq: Four times a day (QID) | ORAL | 0 refills | Status: DC | PRN

## 2021-07-22 MED ORDER — CIPROFLOXACIN HCL 500 MG PO TABS: 500.0000 mg | ORAL_TABLET | Freq: Two times a day (BID) | ORAL | 0 refills | Status: DC

## 2021-07-22 MED ORDER — HYDROMORPHONE HCL 1 MG/ML IJ SOLN
1.0000 mg | Freq: Once | INTRAMUSCULAR | Status: AC
  Administered 2021-07-22: 1 mg via INTRAVENOUS
  Filled 2021-07-22: qty 1

## 2021-07-22 MED ORDER — IOHEXOL 350 MG/ML SOLN
100.0000 mL | Freq: Once | INTRAVENOUS | Status: AC | PRN
  Administered 2021-07-22: 80 mL via INTRAVENOUS

## 2021-07-22 MED ORDER — ONDANSETRON HCL 4 MG/2ML IJ SOLN
4.0000 mg | Freq: Once | INTRAMUSCULAR | Status: AC
  Administered 2021-07-22: 4 mg via INTRAVENOUS
  Filled 2021-07-22: qty 2

## 2021-07-22 NOTE — ED Provider Notes (Signed)
[facility] EMERGENCY DEPARTMENT Provider Note   CSN: [id number] Arrival date & time: 07/22/21  [id number]     History Chief Complaint  Patient presents with   Abdominal Pain    [name] is a 55 y.o. female.  Patient with onset of left lower quadrant abdominal pain yesterday.  Some blood in the bowel movements started in the early morning today.  Patient has a history of irritable bowel syndrome but this feels different.  Patient stated she felt hot but did not confirm a fever.  The blood does not stain the commode water all red.  Patient denies being on antibiotics.      Past Medical History:  Diagnosis Date   Hypothyroidism    IBS (irritable bowel syndrome)    S/P colonoscopy 07/13/2010   [place]: no results available. Reportedly done because heme +    Patient Active Problem List   Diagnosis Date Noted   Prediabetes 06/24/2017   Hypothyroidism 04/01/2016   Premenstrual dysphoric disorder 04/13/2015   GERD (gastroesophageal reflux disease) 03/10/2013   Morbid obesity ([place]) 03/10/2013   Epigastric pain 04/08/2011   Nausea 04/08/2011    Past Surgical History:  Procedure Laterality Date   CESAREAN SECTION     X 2   CHOLECYSTECTOMY     KNEE SURGERY       OB History   No obstetric history on file.     Family History  Problem Relation Age of Onset   Colon cancer Neg Hx     Social History   Tobacco Use   Smoking status: Never   Smokeless tobacco: Never  Substance Use Topics   Alcohol use: No    Alcohol/week: 0.0 standard drinks   Drug use: No    Home Medications Prior to Admission medications   Medication Sig Start Date End Date Taking? Authorizing Provider  ALPRAZolam (XANAX) 0.5 MG tablet TAKE 1/2 TO 1 TABLET BY MOUTH TWICE DAILY AS NEEDED Patient taking differently: Take 0.25-0.5 mg by mouth 2 (two) times daily as needed for anxiety. 07/09/20  Yes Lovena Le, Malena M, DO  ciprofloxacin (CIPRO) 500 MG tablet Take 1 tablet (500 mg total) by mouth 2  (two) times daily. 07/22/21  Yes Fredia Sorrow, MD  clindamycin (CLEOCIN T) 1 % external solution Apply 1 application topically daily. 07/16/21  Yes [provider]  IRON PO Take 1 tablet by mouth every other day. OTC one a day   Yes [provider]  Multiple Vitamin (MULTIVITAMIN) tablet Take 1 tablet by mouth daily.   Yes [provider]  SYNTHROID 100 MCG tablet TAKE 1 TABLET BY MOUTH EVERY DAY Patient taking differently: Take 100 mcg by mouth daily at 6 (six) AM. 02/13/21  Yes Lovena Le, Malena M, DO  traMADol (ULTRAM) 50 MG tablet Take 1 tablet (50 mg total) by mouth every 6 (six) hours as needed. 07/22/21  Yes Fredia Sorrow, MD  doxycycline (VIBRA-TABS) 100 MG tablet Take 100 mg by mouth 2 (two) times daily as needed. 07/16/21   [provider]    Allergies    Erythromycin and Amoxil [amoxicillin]  Review of Systems   Review of Systems  Constitutional:  Negative for chills and fever.  HENT:  Negative for congestion, rhinorrhea and sore throat.   Eyes:  Negative for visual disturbance.  Respiratory:  Negative for cough and shortness of breath.   Cardiovascular:  Negative for chest pain and leg swelling.  Gastrointestinal:  Positive for abdominal pain, blood in stool and diarrhea.  Negative for nausea and vomiting.  Genitourinary:  Negative for dysuria.  Musculoskeletal:  Negative for back pain and neck pain.  Skin:  Negative for rash.  Neurological:  Negative for dizziness, light-headedness and headaches.  Hematological:  Does not bruise/bleed easily.  Psychiatric/Behavioral:  Negative for confusion.    Physical Exam Updated Vital Signs BP 139/79   Pulse 83   Temp 98.1 F (36.7 C) (Oral)   Resp 17   Ht 1.651 m ('5\' 5"'$ )   Wt 104.3 kg   SpO2 98%   BMI 38.27 kg/m   Physical Exam Vitals and nursing note reviewed.  Constitutional:      General: She is not in acute distress.    Appearance: Normal appearance. She is well-developed.  HENT:      Head: Normocephalic and atraumatic.  Eyes:     Extraocular Movements: Extraocular movements intact.     Conjunctiva/sclera: Conjunctivae normal.     Pupils: Pupils are equal, round, and reactive to light.  Cardiovascular:     Rate and Rhythm: Normal rate and regular rhythm.     Heart sounds: No murmur heard. Pulmonary:     Effort: Pulmonary effort is normal. No respiratory distress.     Breath sounds: Normal breath sounds.  Abdominal:     Palpations: Abdomen is soft.     Tenderness: There is abdominal tenderness.     Comments: Tender left lower quadrant.  Musculoskeletal:     Cervical back: Normal range of motion and neck supple.  Skin:    General: Skin is warm and dry.  Neurological:     General: No focal deficit present.     Mental Status: She is alert and oriented to person, place, and time.    ED Results / Procedures / Treatments   Labs (all labs ordered are listed, but only abnormal results are displayed) Labs Reviewed  CBC WITH DIFFERENTIAL/PLATELET - Abnormal; Notable for the following components:      Result Value   Neutro Abs 8.5 (*)    All other components within normal limits  COMPREHENSIVE METABOLIC PANEL - Abnormal; Notable for the following components:   Glucose, Bld 129 (*)    Total Bilirubin 1.3 (*)    All other components within normal limits  POC OCCULT BLOOD, ED - Abnormal; Notable for the following components:   Fecal Occult Bld POSITIVE (*)    All other components within normal limits  LIPASE, BLOOD    EKG None  Radiology CT Abdomen Pelvis W Contrast  Result Date: 07/22/2021 CLINICAL DATA:  Diverticulitis suspected, patient complains of left-sided abdominal pain with diarrhea x1 day. EXAM: CT ABDOMEN AND PELVIS WITH CONTRAST TECHNIQUE: Multidetector CT imaging of the abdomen and pelvis was performed using the standard protocol following bolus administration of intravenous contrast. CONTRAST:  51m OMNIPAQUE IOHEXOL 350 MG/ML SOLN COMPARISON:  None.  FINDINGS: Lower chest: No acute abnormality. Hepatobiliary: No focal liver abnormality is seen. Status post cholecystectomy. No biliary dilatation. Pancreas: Unremarkable. No pancreatic ductal dilatation or surrounding inflammatory changes. Spleen: Normal in size without focal abnormality. Adrenals/Urinary Tract: Adrenal glands are unremarkable. Kidneys are normal, without renal calculi, focal lesion, or hydronephrosis. Bladder is unremarkable. Stomach/Bowel: Diffuse wall thickening with associated pericolonic fat stranding of a 15 cm segment of mid-distal descending colon (series 5, images 57-60). No extraluminal air. No pericolonic fluid collection. No colonic diverticula. The stomach and small bowel are unremarkable. Vascular/Lymphatic: No significant vascular findings are present. No enlarged abdominal or pelvic lymph nodes. Reproductive: Uterus and  bilateral adnexa are unremarkable. Other: No abdominal wall hernia or abnormality. No abdominopelvic ascites. Musculoskeletal: No acute or significant osseous findings. IMPRESSION: Uncomplicated descending colitis, likely infectious versus inflammatory in etiology. Electronically Signed   By: Ileana Roup MD   On: 07/22/2021 10:06    Procedures Procedures   Medications Ordered in ED Medications  0.9 %  sodium chloride infusion ( Intravenous New Bag/Given 07/22/21 0744)  ondansetron (ZOFRAN) injection 4 mg (4 mg Intravenous Given 07/22/21 0739)  HYDROmorphone (DILAUDID) injection 1 mg (1 mg Intravenous Given 07/22/21 0740)  iohexol (OMNIPAQUE) 350 MG/ML injection 100 mL (80 mLs Intravenous Contrast Given 07/22/21 0934)    ED Course  I have reviewed the triage vital signs and the nursing notes.  Pertinent labs & imaging results that were available during my care of the patient were reviewed by me and considered in my medical decision making (see chart for details).    MDM Rules/Calculators/A&P                           Work-up here today CT scan of  the abdomen shows evidence of a colitis.  Without any complicating factors.  No leukocytosis.  No significant anemia hemoglobin was 12.8.  Platelets are normal.  Patient stable for discharge home with precautions that is increased bleeding or toilet water turning all red to return.  We will treat as if it could be an infectious colitis.  With a course of Cipro and tramadol for the pain and patient has primary care doctor to follow-up with. Final Clinical Impression(s) / ED Diagnoses Final diagnoses:  Colitis    Rx / DC Orders ED Discharge Orders          Ordered    ciprofloxacin (CIPRO) 500 MG tablet  2 times daily        07/22/21 1116    traMADol (ULTRAM) 50 MG tablet  Every 6 hours PRN        07/22/21 1116             Fredia Sorrow, MD 07/22/21 1120

## 2021-07-22 NOTE — Discharge Instructions (Addendum)
Take the antibiotic Cipro as directed.  Also take the tramadol as needed for pain.  Return for any worsening with the blood in the bowel movements.  If the commode water turns all red twice in the day that significant amount of bleeding need to get seen again.  Otherwise make an appointment to follow-up with your primary care doctor.

## 2021-07-22 NOTE — ED Triage Notes (Signed)
Pt presents to ED with complaints of generalized abdominal pain and diarrhea with bleeding started yesterday.

## 2021-07-27 ENCOUNTER — Encounter: Payer: Self-pay | Admitting: Family Medicine

## 2021-07-27 ENCOUNTER — Ambulatory Visit (INDEPENDENT_AMBULATORY_CARE_PROVIDER_SITE_OTHER): Payer: No Typology Code available for payment source | Admitting: Family Medicine

## 2021-07-27 ENCOUNTER — Other Ambulatory Visit: Payer: Self-pay

## 2021-07-27 VITALS — BP 126/76 | HR 103 | Temp 94.3°F | Wt 235.0 lb

## 2021-07-27 DIAGNOSIS — K529 Noninfective gastroenteritis and colitis, unspecified: Secondary | ICD-10-CM

## 2021-07-27 NOTE — Progress Notes (Signed)
Patient ID: [name], female    DOB: March 12, 1966, 55 y.o.   MRN: [id number]   Chief Complaint  Patient presents with   Abdominal Pain   Subjective:    HPI Pt went to [facility] ER on 07/22/21. Pt states symptoms started 07/21/21. Pt states she was have left side abdominal pain broke out in sweat when trying to use bathroom. Morning of 07/22/21 pt had bright red blood in stool and pt went to ER at that time. Pt had [facility] at [facility] along with blood work. Prescribed Cipro and Tramadol. Pt has couple more days of Cipro. No blood in stool at this time. No pain since Saturday. Feeling tired; has been on bland diet.   Eating bland diet. Not having any bleeding. Weakness. Firm at then can having watery, loose stool. Not going constantly. Only 2x had loose stool since 07/22/21.  Never had that before.  Had h/o Ibs. On 07/21/21- stomach pain and then had blood in stool. Broke out in sweats when trying to go to bathroom.  Dx with uncompliated descending colitis.  H/o anxiety- Pt was getting xanax from her gyn.   Medical History [name] has a past medical history of Hypothyroidism, IBS (irritable bowel syndrome), and S/P colonoscopy (07/13/2010).   Outpatient Encounter Medications as of 07/27/2021  Medication Sig   ALPRAZolam (XANAX) 0.5 MG tablet TAKE 1/2 TO 1 TABLET BY MOUTH TWICE DAILY AS NEEDED (Patient taking differently: Take 0.25-0.5 mg by mouth 2 (two) times daily as needed for anxiety.)   ciprofloxacin (CIPRO) 500 MG tablet Take 1 tablet (500 mg total) by mouth 2 (two) times daily.   clindamycin (CLEOCIN T) 1 % external solution Apply 1 application topically daily.   IRON PO Take 1 tablet by mouth every other day. OTC one a day   Multiple Vitamin (MULTIVITAMIN) tablet Take 1 tablet by mouth daily.   OMEPRAZOLE PO Take by mouth as needed.   SYNTHROID 100 MCG tablet TAKE 1 TABLET BY MOUTH EVERY DAY (Patient taking differently: Take 100 mcg by mouth daily at 6 (six) AM.)    traMADol (ULTRAM) 50 MG tablet Take 1 tablet (50 mg total) by mouth every 6 (six) hours as needed.   doxycycline (VIBRA-TABS) 100 MG tablet Take 100 mg by mouth 2 (two) times daily as needed. (Patient not taking: Reported on 07/27/2021)   No facility-administered encounter medications on file as of 07/27/2021.     Review of Systems  Constitutional:  Negative for chills and fever.  HENT:  Negative for congestion, rhinorrhea and sore throat.   Respiratory:  Negative for cough, shortness of breath and wheezing.   Cardiovascular:  Negative for chest pain and leg swelling.  Gastrointestinal:  Positive for abdominal pain (resolved). Negative for diarrhea, nausea and vomiting.       +loose stools  Genitourinary:  Negative for dysuria and frequency.  Musculoskeletal:  Negative for arthralgias and back pain.  Skin:  Negative for rash.  Neurological:  Negative for dizziness, weakness and headaches.    Vitals BP 126/76   Pulse (!) 103   Temp (!) 94.3 F (34.6 C)   Wt 235 lb (106.6 kg)   SpO2 98%   BMI 39.11 kg/m   Objective:   Physical Exam Vitals and nursing note reviewed.  Constitutional:      Appearance: Normal appearance.  HENT:     Head: Normocephalic and atraumatic.     Nose: Nose normal.     Mouth/Throat:  Mouth: Mucous membranes are moist.     Pharynx: Oropharynx is clear.  Eyes:     Extraocular Movements: Extraocular movements intact.     Conjunctiva/sclera: Conjunctivae normal.     Pupils: Pupils are equal, round, and reactive to light.  Cardiovascular:     Rate and Rhythm: Regular rhythm. Tachycardia present.     Pulses: Normal pulses.     Heart sounds: Normal heart sounds.  Pulmonary:     Effort: Pulmonary effort is normal.     Breath sounds: Normal breath sounds. No wheezing, rhonchi or rales.  Musculoskeletal:        General: Normal range of motion.     Right lower leg: No edema.     Left lower leg: No edema.  Skin:    General: Skin is warm and dry.      Findings: No lesion or rash.  Neurological:     General: No focal deficit present.     Mental Status: She is alert and oriented to person, place, and time.  Psychiatric:        Mood and Affect: Mood normal.        Behavior: Behavior normal.     Assessment and Plan   1. Colitis  Finish cipro. Cont bland diet and BRAT diet. Call or rto if worsening pain or bleeding.   Return if symptoms worsen or fail to improve.

## 2021-12-21 ENCOUNTER — Telehealth: Payer: No Typology Code available for payment source | Admitting: Emergency Medicine

## 2021-12-21 DIAGNOSIS — N3001 Acute cystitis with hematuria: Secondary | ICD-10-CM

## 2021-12-21 MED ORDER — NITROFURANTOIN MONOHYD MACRO 100 MG PO CAPS: 100.0000 mg | ORAL_CAPSULE | Freq: Two times a day (BID) | ORAL | 0 refills | Status: DC

## 2021-12-21 NOTE — Progress Notes (Signed)
E-Visit for Urinary Problems ? ?We are sorry that you are not feeling well.  Here is how we plan to help! ? ?Based on what you shared with me it looks like you most likely have a simple urinary tract infection. ? ?A UTI (Urinary Tract Infection) is a bacterial infection of the bladder. ? ?Most cases of urinary tract infections are simple to treat but a key part of your care is to encourage you to drink plenty of fluids and watch your symptoms carefully. ? ?I have prescribed MacroBid 100 mg twice a day for 5 days.  Your symptoms should gradually improve. Call us if the burning in your urine worsens, you develop worsening fever, back pain or pelvic pain or if your symptoms do not resolve after completing the antibiotic. ? ?Urinary tract infections can be prevented by drinking plenty of water to keep your body hydrated.  Also be sure when you wipe, wipe from front to back and don't hold it in!  If possible, empty your bladder every 4 hours. ? ?HOME CARE ?Drink plenty of fluids ?Compete the full course of the antibiotics even if the symptoms resolve ?Remember, when you need to go?go. Holding in your urine can increase the likelihood of getting a UTI! ?GET HELP RIGHT AWAY IF: ?You cannot urinate ?You get a high fever ?Worsening back pain occurs ?You see blood in your urine ?You feel sick to your stomach or throw up ?You feel like you are going to pass out ? ?MAKE SURE YOU  ?Understand these instructions. ?Will watch your condition. ?Will get help right away if you are not doing well or get worse. ? ? ?Thank you for choosing an e-visit. ? ?Your e-visit answers were reviewed by a board certified advanced clinical practitioner to complete your personal care plan. Depending upon the condition, your plan could have included both over the counter or prescription medications. ? ?Please review your pharmacy choice. Make sure the pharmacy is open so you can pick up prescription now. If there is a problem, you may contact your  provider through MyChart messaging and have the prescription routed to another pharmacy.  Your safety is important to us. If you have drug allergies check your prescription carefully.  ? ?For the next 24 hours you can use MyChart to ask questions about today's visit, request a non-urgent call back, or ask for a work or school excuse. ?You will get an email in the next two days asking about your experience. I hope that your e-visit has been valuable and will speed your recovery. ? ?I have spent 5 minutes in review of e-visit questionnaire, review and updating patient chart, medical decision making and response to patient.  ? ?Mikolaj Woolstenhulme, PhD, FNP-BC ?  ?

## 2021-12-23 ENCOUNTER — Other Ambulatory Visit: Payer: Self-pay

## 2021-12-23 ENCOUNTER — Ambulatory Visit (INDEPENDENT_AMBULATORY_CARE_PROVIDER_SITE_OTHER): Payer: No Typology Code available for payment source | Admitting: Family Medicine

## 2021-12-23 VITALS — BP 137/83 | HR 66 | Ht 65.0 in | Wt 249.2 lb

## 2021-12-23 DIAGNOSIS — Z1211 Encounter for screening for malignant neoplasm of colon: Secondary | ICD-10-CM

## 2021-12-23 DIAGNOSIS — E785 Hyperlipidemia, unspecified: Secondary | ICD-10-CM | POA: Diagnosis not present

## 2021-12-23 DIAGNOSIS — Z130 Encounter for screening for diseases of the blood and blood-forming organs and certain disorders involving the immune mechanism: Secondary | ICD-10-CM

## 2021-12-23 DIAGNOSIS — K219 Gastro-esophageal reflux disease without esophagitis: Secondary | ICD-10-CM

## 2021-12-23 DIAGNOSIS — R7303 Prediabetes: Secondary | ICD-10-CM | POA: Diagnosis not present

## 2021-12-23 DIAGNOSIS — E038 Other specified hypothyroidism: Secondary | ICD-10-CM

## 2021-12-23 NOTE — Progress Notes (Signed)
Subjective:  Patient ID: [name], female    DOB: Jul 19, 1966  Age: 56 y.o. MRN: [id number]  CC: Chief Complaint  Patient presents with   Hypothyroidism    Establish care- due lab work    HPI:  56 year old female with morbid obesity, GERD, hypothyroidism presents for follow-up and to establish care with me.  Patient has not had her TSH checked since early last year.  Last TSH was 4.55.  She is currently on Synthroid 100 MCG daily.  She is in need of a refill.  She would like lab testing to make sure she is on the correct dose.  Patient had a bout of colitis and August 2022.  Patient states that her last colonoscopy was when she was 56 years of age.  She would like to see GI for colonoscopy.  I cannot find any records of her prior colonoscopy.  GERD is stable on omeprazole.  Patient has no other complaints or concerns at this time.  Patient Active Problem List   Diagnosis Date Noted   Prediabetes 06/24/2017   Hypothyroidism 04/01/2016   GERD (gastroesophageal reflux disease) 03/10/2013   Morbid obesity ([place]) 03/10/2013    Social Hx   Social History   Socioeconomic History   Marital status: Married    Spouse name: Not on file   Number of children: 2   Years of education: Not on file   Highest education level: Not on file  Occupational History   Occupation: Chief Financial Officer: [employer]    Comment: [place], [place]  Tobacco Use   Smoking status: Never   Smokeless tobacco: Never  Substance and Sexual Activity   Alcohol use: No    Alcohol/week: 0.0 standard drinks   Drug use: No   Sexual activity: Yes    Partners: Male    Birth control/protection: None    Comment: spouse  Other Topics Concern   Not on file  Social History Narrative   Not on file   Social Determinants of Health   Financial Resource Strain: Not on file  Food Insecurity: Not on file  Transportation Needs: Not on file  Physical Activity: Not on file  Stress: Not on file  Social  Connections: Not on file    Review of Systems  Constitutional: Negative.   Gastrointestinal:  Positive for diarrhea.    Objective:  BP 137/83    Pulse 66    Ht _0  (1.651 m)    Wt 249 lb 3.2 oz (113 kg)    SpO2 100%    BMI 41.47 kg/m   BP/Weight 12/23/2021 07/27/2021 2/87/8676  Systolic BP 720 947 096  Diastolic BP 83 76 73  Wt. (Lbs) 249.2 235 230  BMI 41.47 39.11 38.27    Physical Exam Vitals and nursing note reviewed.  Constitutional:      General: She is not in acute distress.    Appearance: Normal appearance. She is obese. She is not ill-appearing.  HENT:     Head: Normocephalic and atraumatic.  Cardiovascular:     Rate and Rhythm: Normal rate and regular rhythm.  Pulmonary:     Effort: Pulmonary effort is normal.     Breath sounds: Normal breath sounds. No wheezing, rhonchi or rales.  Neurological:     Mental Status: She is alert.  Psychiatric:        Mood and Affect: Mood normal.        Behavior: Behavior normal.    Lab Results  Component Value Date   WBC 10.3 07/22/2021   HGB 12.8 07/22/2021   HCT 40.6 07/22/2021   PLT 304 07/22/2021   GLUCOSE 129 (H) 07/22/2021   CHOL 253 (H) 02/09/2021   TRIG 181 (H) 02/09/2021   HDL 57 02/09/2021   LDLCALC 163 (H) 02/09/2021   ALT 39 07/22/2021   AST 37 07/22/2021   NA 138 07/22/2021   K 3.5 07/22/2021   CL 103 07/22/2021   CREATININE 0.90 07/22/2021   BUN 10 07/22/2021   CO2 29 07/22/2021   TSH 4.550 (H) 02/09/2021   HGBA1C 5.8 06/24/2017     Assessment & Plan:   Problem List Items Addressed This Visit       Digestive   GERD (gastroesophageal reflux disease)    Stable on omeprazole.        Endocrine   Hypothyroidism - Primary    Labs ordered.  We will continue current dosing of Synthroid.  Will refill after labs have returned in case she needs a dose change.      Relevant Orders   TSH + free T4     Other   Morbid obesity ([place])   Relevant Orders   CMP14+EGFR   Prediabetes   Relevant  Orders   Hemoglobin A1c   Other Visit Diagnoses     Hyperlipidemia, unspecified hyperlipidemia type       Relevant Orders   Lipid panel   Screening for deficiency anemia       Relevant Orders   CBC   Encounter for screening colonoscopy       Relevant Orders   Ambulatory referral to Gastroenterology       Follow-up:  Return in about 1 year (around 12/23/2022).  [place]

## 2021-12-23 NOTE — Patient Instructions (Signed)
Fasting labs.  We will call or message with results.  I will place referral for colonoscopy.  Follow up annually.

## 2021-12-24 NOTE — Assessment & Plan Note (Signed)
Labs ordered.  We will continue current dosing of Synthroid.  Will refill after labs have returned in case she needs a dose change.

## 2021-12-24 NOTE — Assessment & Plan Note (Signed)
Stable on omeprazole. 

## 2021-12-25 LAB — CBC
Hematocrit: 43.1 % (ref 34.0–46.6)
Hemoglobin: 13.8 g/dL (ref 11.1–15.9)
MCH: 25.1 pg — ABNORMAL LOW (ref 26.6–33.0)
MCHC: 32 g/dL (ref 31.5–35.7)
MCV: 78 fL — ABNORMAL LOW (ref 79–97)
Platelets: 364 10*3/uL (ref 150–450)
RBC: 5.5 x10E6/uL — ABNORMAL HIGH (ref 3.77–5.28)
RDW: 13.7 % (ref 11.7–15.4)
WBC: 8 10*3/uL (ref 3.4–10.8)

## 2021-12-25 LAB — CMP14+EGFR
ALT: 27 IU/L (ref 0–32)
AST: 25 IU/L (ref 0–40)
Albumin/Globulin Ratio: 1.6 (ref 1.2–2.2)
Albumin: 4.4 g/dL (ref 3.8–4.9)
Alkaline Phosphatase: 145 IU/L — ABNORMAL HIGH (ref 44–121)
BUN/Creatinine Ratio: 12 (ref 9–23)
BUN: 12 mg/dL (ref 6–24)
Bilirubin Total: 1.2 mg/dL (ref 0.0–1.2)
CO2: 26 mmol/L (ref 20–29)
Calcium: 10 mg/dL (ref 8.7–10.2)
Chloride: 102 mmol/L (ref 96–106)
Creatinine, Ser: 0.99 mg/dL (ref 0.57–1.00)
Globulin, Total: 2.7 g/dL (ref 1.5–4.5)
Glucose: 94 mg/dL (ref 70–99)
Potassium: 4.5 mmol/L (ref 3.5–5.2)
Sodium: 145 mmol/L — ABNORMAL HIGH (ref 134–144)
Total Protein: 7.1 g/dL (ref 6.0–8.5)
eGFR: 68 mL/min/{1.73_m2} (ref >59)

## 2021-12-25 LAB — HEMOGLOBIN A1C
Est. average glucose Bld gHb Est-mCnc: 114 mg/dL
Hgb A1c MFr Bld: 5.6 % (ref 4.8–5.6)

## 2021-12-25 LAB — TSH+FREE T4
Free T4: 1.28 ng/dL (ref 0.82–1.77)
TSH: 1.91 u[IU]/mL (ref 0.450–4.500)

## 2021-12-25 LAB — LIPID PANEL
Chol/HDL Ratio: 4.6 ratio — ABNORMAL HIGH (ref 0.0–4.4)
Cholesterol, Total: 246 mg/dL — ABNORMAL HIGH (ref 100–199)
HDL: 53 mg/dL (ref >39)
LDL Chol Calc (NIH): 164 mg/dL — ABNORMAL HIGH (ref 0–99)
Triglycerides: 160 mg/dL — ABNORMAL HIGH (ref 0–149)
VLDL Cholesterol Cal: 29 mg/dL (ref 5–40)

## 2021-12-28 ENCOUNTER — Other Ambulatory Visit: Payer: Self-pay | Admitting: Family Medicine

## 2021-12-28 DIAGNOSIS — R748 Abnormal levels of other serum enzymes: Secondary | ICD-10-CM

## 2022-01-05 ENCOUNTER — Other Ambulatory Visit: Payer: Self-pay | Admitting: Family Medicine

## 2022-01-09 LAB — GAMMA GT: GGT: 9 IU/L (ref 0–60)

## 2022-07-07 ENCOUNTER — Other Ambulatory Visit: Payer: Self-pay | Admitting: *Deleted

## 2022-07-07 MED ORDER — SYNTHROID 100 MCG PO TABS: 100.0000 ug | ORAL_TABLET | Freq: Every day | ORAL | 0 refills | Status: DC

## 2022-10-02 ENCOUNTER — Other Ambulatory Visit: Payer: Self-pay | Admitting: Family Medicine

## 2022-11-15 ENCOUNTER — Encounter: Payer: Self-pay | Admitting: Family Medicine

## 2022-11-15 ENCOUNTER — Ambulatory Visit (INDEPENDENT_AMBULATORY_CARE_PROVIDER_SITE_OTHER): Payer: No Typology Code available for payment source | Admitting: Family Medicine

## 2022-11-15 VITALS — BP 110/70 | HR 97 | Wt 246.0 lb

## 2022-11-15 DIAGNOSIS — K2900 Acute gastritis without bleeding: Secondary | ICD-10-CM | POA: Diagnosis not present

## 2022-11-15 DIAGNOSIS — Z8719 Personal history of other diseases of the digestive system: Secondary | ICD-10-CM | POA: Diagnosis not present

## 2022-11-15 DIAGNOSIS — Z1211 Encounter for screening for malignant neoplasm of colon: Secondary | ICD-10-CM

## 2022-11-15 MED ORDER — PANTOPRAZOLE SODIUM 40 MG PO TBEC: 40.0000 mg | DELAYED_RELEASE_TABLET | Freq: Two times a day (BID) | ORAL | 2 refills | Status: DC

## 2022-11-15 MED ORDER — SUCRALFATE 1 G PO TABS: 1.0000 g | ORAL_TABLET | Freq: Three times a day (TID) | ORAL | 2 refills | Status: DC

## 2022-11-15 NOTE — Progress Notes (Signed)
Subjective:  Patient ID: [name], female    DOB: Jun 18, 1966  Age: 56 y.o. MRN: [id number]  CC: Chief Complaint  Patient presents with   Abdominal Pain    Nausea/diarrhea. Going on about one week. Pain in upper and lower left quadrant. Has improved since making appt. Omeprazole usually helps (pt double up-takes every 12 hrs just when having issues).  Left hip pain when sitting (unsure if it is related)    HPI:  56 year old female presents for evaluation of the above.  1 week history of upper abdominal pain, belching, burping, worsening reflux.  She states that she has had some left lower quadrant pain as well.  She has been taking omeprazole with some improvement but no resolution.  No known inciting factor for recurrence of symptoms.  Patient states she has had some diarrhea although this is now resolved.  She is also had nausea but this is resolved as well.  Referrals have been placed regarding her colonoscopy but there have been issues with communication.  She is overdue for colonoscopy.  Patient has a history of colitis.  No current bloody stool.  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[employer]    Comment: [place], [place]  Tobacco Use   Smoking status: Never   Smokeless tobacco: Never  Substance and Sexual Activity   Alcohol use: No    Alcohol/week: 0.0 standard drinks of alcohol   Drug use: No   Sexual activity: Yes    Partners: Male    Birth control/protection: None    Comment: spouse  Other Topics  Concern   Not on file  Social History Narrative   Not on file   Social Determinants of Health   Financial Resource Strain: Not on file  Food Insecurity: Not on file  Transportation Needs: Not on file  Physical Activity: Not on file  Stress: Not on file  Social Connections: Not on file    Review of Systems Per HPI  Objective:  BP 110/70   Pulse 97   Wt 246 lb (111.6 kg)   SpO2 99%   BMI 40.94 kg/m      11/15/2022    9:19 AM 12/23/2021    2:04 PM 07/27/2021   10:23 AM  BP/Weight  Systolic BP 354 562 563  Diastolic BP 70 83 76  Wt. (Lbs) 246 249.2 235  BMI 40.94 kg/m2 41.47 kg/m2 39.11 kg/m2    Physical Exam Vitals and nursing note reviewed.  Constitutional:      General: She is not in acute distress.    Appearance: She is well-developed. She is obese.  HENT:     Head: Normocephalic and atraumatic.  Cardiovascular:     Rate and Rhythm: Normal rate and regular rhythm.     Heart sounds: No murmur heard. Pulmonary:     Effort: Pulmonary effort is normal.     Breath sounds: Normal breath sounds. No wheezing, rhonchi or rales.  Abdominal:  General: There is no distension.     Palpations: Abdomen is soft.     Comments: Tenderness in the epigastric region as well as the left lower quadrant.  No rebound or guarding.  Neurological:     Mental Status: She is alert.     Lab Results  Component Value Date   WBC 8.0 12/24/2021   HGB 13.8 12/24/2021   HCT 43.1 12/24/2021   PLT 364 12/24/2021   GLUCOSE 94 12/24/2021   CHOL 246 (H) 12/24/2021   TRIG 160 (H) 12/24/2021   HDL 53 12/24/2021   LDLCALC 164 (H) 12/24/2021   ALT 27 12/24/2021   AST 25 12/24/2021   NA 145 (H) 12/24/2021   K 4.5 12/24/2021   CL 102 12/24/2021   CREATININE 0.99 12/24/2021   BUN 12 12/24/2021   CO2 26 12/24/2021   TSH 1.910 12/24/2021   HGBA1C 5.6 12/24/2021     Assessment & Plan:   Problem List Items Addressed This Visit       Digestive   Acute gastritis without hemorrhage  - Primary    Patient peers to be experiencing acute gastritis.  Treating with Protonix and Carafate.  Referring to GI.      Relevant Medications   pantoprazole (PROTONIX) 40 MG tablet   sucralfate (CARAFATE) 1 g tablet   Other Relevant Orders   Ambulatory referral to Gastroenterology   Other Visit Diagnoses     Encounter for screening colonoscopy       Relevant Orders   Ambulatory referral to Gastroenterology   History of colitis       Relevant Orders   Ambulatory referral to Gastroenterology       Meds ordered this encounter  Medications   pantoprazole (PROTONIX) 40 MG tablet    Sig: Take 1 tablet (40 mg total) by mouth 2 (two) times daily before a meal.    Dispense:  60 tablet    Refill:  2   sucralfate (CARAFATE) 1 g tablet    Sig: Take 1 tablet (1 g total) by mouth in the morning, at noon, and at bedtime.    Dispense:  90 tablet    Refill:  2    Follow-up:  Return if symptoms worsen or fail to improve.  [place]

## 2022-11-15 NOTE — Patient Instructions (Signed)
Medications as prescribed.  Referral placed again.  Call with concerns.  Take care  Dr. Lacinda Axon

## 2022-11-15 NOTE — Assessment & Plan Note (Signed)
Patient peers to be experiencing acute gastritis.  Treating with Protonix and Carafate.  Referring to GI.

## 2022-12-29 ENCOUNTER — Other Ambulatory Visit: Payer: Self-pay | Admitting: Family Medicine

## 2023-01-27 ENCOUNTER — Encounter: Payer: Self-pay | Admitting: Family Medicine

## 2023-02-01 ENCOUNTER — Other Ambulatory Visit (INDEPENDENT_AMBULATORY_CARE_PROVIDER_SITE_OTHER): Payer: 59

## 2023-02-01 ENCOUNTER — Ambulatory Visit (INDEPENDENT_AMBULATORY_CARE_PROVIDER_SITE_OTHER): Payer: 59 | Admitting: Internal Medicine

## 2023-02-01 ENCOUNTER — Encounter: Payer: Self-pay | Admitting: Internal Medicine

## 2023-02-01 DIAGNOSIS — K219 Gastro-esophageal reflux disease without esophagitis: Secondary | ICD-10-CM

## 2023-02-01 DIAGNOSIS — R1013 Epigastric pain: Secondary | ICD-10-CM

## 2023-02-01 DIAGNOSIS — R1310 Dysphagia, unspecified: Secondary | ICD-10-CM | POA: Diagnosis not present

## 2023-02-01 DIAGNOSIS — Z1211 Encounter for screening for malignant neoplasm of colon: Secondary | ICD-10-CM | POA: Diagnosis not present

## 2023-02-01 LAB — IBC + FERRITIN
Ferritin: 40.9 ng/mL (ref 10.0–291.0)
Iron: 55 ug/dL (ref 42–145)
Saturation Ratios: 16.8 % — ABNORMAL LOW (ref 20.0–50.0)
TIBC: 327.6 ug/dL (ref 250.0–450.0)
Transferrin: 234 mg/dL (ref 212.0–360.0)

## 2023-02-01 LAB — C-REACTIVE PROTEIN: CRP: 1.2 mg/dL (ref 0.5–20.0)

## 2023-02-01 MED ORDER — NA SULFATE-K SULFATE-MG SULF 17.5-3.13-1.6 GM/177ML PO SOLN: 1.0000 | ORAL | 0 refills | Status: DC

## 2023-02-01 NOTE — Patient Instructions (Signed)
_______________________________________________________  If your blood pressure at your visit was 140/90 or greater, please contact your primary care physician to follow up on this.  If you are age 56 or younger, your body mass index should be between 19-25. Your Body mass index is 40.44 kg/m. If this is out of the aformentioned range listed, please consider follow up with your Primary Care Provider.  ________________________________________________________  The [facility] providers would like to encourage you to use [facility] to communicate with providers for non-urgent requests or questions.  Due to long hold times on the telephone, sending your provider a message by [facility] may be a faster and more efficient way to get a response.  Please allow 48 business hours for a response.  Please remember that this is for non-urgent requests.  _______________________________________________________  Your provider has requested that you go to the basement level for lab work before leaving today. Press "B" on the elevator. The lab is located at the first door on the left as you exit the elevator.  You have been scheduled for an endoscopy and colonoscopy. Please follow the written instructions given to you at your visit today. Please pick up your prep supplies at the pharmacy within the next 1-3 days. If you use inhalers (even only as needed), please bring them with you on the day of your procedure.  Due to recent changes in healthcare laws, you may see the results of your imaging and laboratory studies on MyChart before your provider has had a chance to review them.  We understand that in some cases there may be results that are confusing or concerning to you. Not all laboratory results come back in the same time frame and the provider may be waiting for multiple results in order to interpret others.  Please give [place] 48 hours in order for your provider to thoroughly review all the results before contacting the  office for clarification of your results.   Thank you for entrusting me with your care and choosing [facility].  Dr Lorenso Courier

## 2023-02-01 NOTE — Progress Notes (Signed)
Chief Complaint: GERD  HPI : 57 year old female with history of obesity, hypothyroidism, GERD, IBS presents with GERD  She has had GERD for years that manifests as chest burning, which worsened after menopause. She cannot eat as many foods as she used to due to issues with GERD. She has cut out gluten, which has helped. Her IBS usually manifests as diarrhea. If she eats fried foods, this will cause more diarrhea. She on average has 1 BM per day that is watery. If she does probiotics, the stools become more normal. She has ab pain when the diarrhea comes on but then this ab pain gets better after she has a BM. Endorses epigastric ab pain that seems to be associated with her indigestion/GERD. Endorses dysphagia to dried meats in the lower throat. She is on omeprazole 20 mg QD or BID, which helps with GERD.  She has been losing some weight deliberately with intermittent fasting. Endorses nausea with indigestion. Denies vomiting. Last colonoscopy in 2011 with Dr. Earlean Shawl was normal. Last EGD was in 2012 that was normal. Endorse family history of colon polyps in her father. She did have an episode of colitis in 2022 that may have been due to an infection. This ended up resolving on its own with some antibiotic therapy. Endorses occasional rectal bleeding. Denies NSAIDs.   Wt Readings from Last 3 Encounters:  02/01/23 243 lb (110.2 kg)  11/15/22 246 lb (111.6 kg)  12/23/21 249 lb 3.2 oz (113 kg)   Past Medical History:  Diagnosis Date   GERD (gastroesophageal reflux disease)    Hypothyroidism    IBS (irritable bowel syndrome)    S/P colonoscopy 07/13/2010   [place]: no results available. Reportedly done because heme +   Past Surgical History:  Procedure Laterality Date   CESAREAN SECTION     X 2   CHOLECYSTECTOMY     KNEE SURGERY     Family History  Problem Relation Age of Onset   Diabetes Father    Colon cancer Neg Hx    Social History   Tobacco Use   Smoking status: Never    Smokeless tobacco: Never  Vaping Use   Vaping Use: Never used  Substance Use Topics   Alcohol use: No    Alcohol/week: 0.0 standard drinks of alcohol   Drug use: No   Current Outpatient Medications  Medication Sig Dispense Refill   ALPRAZolam (XANAX) 0.5 MG tablet TAKE 1/2 TO 1 TABLET BY MOUTH TWICE DAILY AS NEEDED (Patient taking differently: Take 0.25-0.5 mg by mouth 2 (two) times daily as needed for anxiety.) 30 tablet 0   clindamycin (CLEOCIN T) 1 % external solution Apply 1 application topically daily.     Multiple Vitamin (MULTIVITAMIN) tablet Take 1 tablet by mouth daily.     omeprazole (PRILOSEC OTC) 20 MG tablet Take 20 mg by mouth daily. Once to twice a day prn     Probiotic Product ([place]) Take 1 Capful by mouth daily.     sucralfate (CARAFATE) 1 g tablet Take 1 tablet (1 g total) by mouth in the morning, at noon, and at bedtime. (Patient taking differently: Take 1 g by mouth 3 (three) times daily as needed.) 90 tablet 2   SYNTHROID 100 MCG tablet TAKE 1 TABLET(100 MCG) BY MOUTH DAILY 90 tablet 0   No current facility-administered medications for this visit.   Allergies  Allergen Reactions   Erythromycin Hives   Amoxil [Amoxicillin] Rash    Rash  on palms of hands; peeling of hands      Review of Systems: All systems reviewed and negative except where noted in HPI.   Physical Exam: BP (!) 140/82   Pulse 88   Ht 5' 5"$  (1.651 m)   Wt 243 lb (110.2 kg)   LMP 01/11/2016   SpO2 98%   BMI 40.44 kg/m  Constitutional: Pleasant,well-developed, female in no acute distress. HEENT: Normocephalic and atraumatic. Conjunctivae are normal. No scleral icterus. Cardiovascular: Normal rate, regular rhythm.  Pulmonary/chest: Effort normal and breath sounds normal. No wheezing, rales or rhonchi. Abdominal: Soft, nondistended, nontender. Bowel sounds active throughout. There are no masses palpable. No hepatomegaly. Extremities: No edema Neurological: Alert and  oriented to person place and time. Skin: Skin is warm and dry. No rashes noted. Psychiatric: Normal mood and affect. Behavior is normal.  Labs 12/2021: CBC unremarkable. CMP with mildly elevateda lk phos  CT A/P w/contrast 07/22/21: IMPRESSION: Uncomplicated descending colitis, likely infectious versus inflammatory in etiology.  ASSESSMENT AND PLAN: GERD Dysphagia Epigastric ab pain Elevated alk phos Colon cancer screening History of colitis IBS Patient presents with GERD for years. She does also notice some issues with dysphagia and epigastric ab pain. Thus will plan for further evaluation with albs and EGD. Will also plan for a colonoscopy for colon cancer screening. Patient has history of IBS and an episode of colitis in 2022 that self-resolved. Her last colonoscopy was in 2011.  - GERD handout - Check TTG IgA, IgA, CRP, ferritin/IBC - EGD/colonoscopy [facility]  Christia Reading, MD  I spent 53 minutes of time, including in depth chart review, independent review of results as outlined above, communicating results with the patient directly, face-to-face time with the patient, coordinating care, ordering studies and medications as appropriate, and documentation.

## 2023-02-02 LAB — TISSUE TRANSGLUTAMINASE, IGA: (tTG) Ab, IgA: <1 U/mL

## 2023-02-02 LAB — IGA: Immunoglobulin A: 312 mg/dL — ABNORMAL HIGH (ref 47–310)

## 2023-02-07 ENCOUNTER — Encounter (HOSPITAL_COMMUNITY): Payer: Self-pay | Admitting: *Deleted

## 2023-02-07 ENCOUNTER — Other Ambulatory Visit: Payer: Self-pay

## 2023-02-07 ENCOUNTER — Emergency Department (HOSPITAL_COMMUNITY): Payer: 59

## 2023-02-07 ENCOUNTER — Emergency Department (HOSPITAL_COMMUNITY)
Admission: EM | Admit: 2023-02-07 | Discharge: 2023-02-07 | Disposition: A | Discharge status: Home / Self Care | Payer: 59 | Attending: Emergency Medicine | Admitting: Emergency Medicine

## 2023-02-07 DIAGNOSIS — E039 Hypothyroidism, unspecified: Secondary | ICD-10-CM | POA: Diagnosis not present

## 2023-02-07 DIAGNOSIS — R1013 Epigastric pain: Secondary | ICD-10-CM | POA: Insufficient documentation

## 2023-02-07 DIAGNOSIS — K529 Noninfective gastroenteritis and colitis, unspecified: Secondary | ICD-10-CM

## 2023-02-07 LAB — COMPREHENSIVE METABOLIC PANEL
ALT: 25 U/L (ref 0–44)
AST: 31 U/L (ref 15–41)
Albumin: 3.8 g/dL (ref 3.5–5.0)
Alkaline Phosphatase: 108 U/L (ref 38–126)
Anion gap: 11 (ref 5–15)
BUN: 12 mg/dL (ref 6–20)
CO2: 24 mmol/L (ref 22–32)
Calcium: 8.9 mg/dL (ref 8.9–10.3)
Chloride: 102 mmol/L (ref 98–111)
Creatinine, Ser: 0.95 mg/dL (ref 0.44–1.00)
GFR, Estimated: >60 mL/min (ref >60)
Glucose, Bld: 122 mg/dL — ABNORMAL HIGH (ref 70–99)
Potassium: 4 mmol/L (ref 3.5–5.1)
Sodium: 137 mmol/L (ref 135–145)
Total Bilirubin: 1.2 mg/dL (ref 0.3–1.2)
Total Protein: 7.7 g/dL (ref 6.5–8.1)

## 2023-02-07 LAB — CBC
HCT: 41.5 % (ref 36.0–46.0)
Hemoglobin: 13.2 g/dL (ref 12.0–15.0)
MCH: 25.8 pg — ABNORMAL LOW (ref 26.0–34.0)
MCHC: 31.8 g/dL (ref 30.0–36.0)
MCV: 81.1 fL (ref 80.0–100.0)
Platelets: 273 10*3/uL (ref 150–400)
RBC: 5.12 MIL/uL — ABNORMAL HIGH (ref 3.87–5.11)
RDW: 14.6 % (ref 11.5–15.5)
WBC: 6.1 10*3/uL (ref 4.0–10.5)
nRBC: 0 % (ref 0.0–0.2)

## 2023-02-07 LAB — URINALYSIS, ROUTINE W REFLEX MICROSCOPIC
Bacteria, UA: NONE SEEN
Bilirubin Urine: NEGATIVE
Glucose, UA: NEGATIVE mg/dL
Ketones, ur: NEGATIVE mg/dL
Leukocytes,Ua: NEGATIVE
Nitrite: NEGATIVE
Protein, ur: NEGATIVE mg/dL
Specific Gravity, Urine: 1.015 (ref 1.005–1.030)
pH: 6 (ref 5.0–8.0)

## 2023-02-07 LAB — TROPONIN I (HIGH SENSITIVITY)
Troponin I (High Sensitivity): <2 ng/L (ref <18)
Troponin I (High Sensitivity): 4 ng/L (ref <18)

## 2023-02-07 LAB — POC URINE PREG, ED: Preg Test, Ur: NEGATIVE

## 2023-02-07 LAB — LIPASE, BLOOD: Lipase: 27 U/L (ref 11–51)

## 2023-02-07 MED ORDER — FAMOTIDINE IN NACL 20-0.9 MG/50ML-% IV SOLN
20.0000 mg | Freq: Once | INTRAVENOUS | Status: AC
  Administered 2023-02-07: 20 mg via INTRAVENOUS
  Filled 2023-02-07: qty 50

## 2023-02-07 MED ORDER — CIPROFLOXACIN HCL 500 MG PO TABS: 500.0000 mg | ORAL_TABLET | Freq: Two times a day (BID) | ORAL | 0 refills | Status: DC

## 2023-02-07 MED ORDER — ALUM & MAG HYDROXIDE-SIMETH 200-200-20 MG/5ML PO SUSP
30.0000 mL | Freq: Once | ORAL | Status: AC
  Administered 2023-02-07: 30 mL via ORAL
  Filled 2023-02-07: qty 30

## 2023-02-07 MED ORDER — IOHEXOL 300 MG/ML  SOLN
75.0000 mL | Freq: Once | INTRAMUSCULAR | Status: AC | PRN
  Administered 2023-02-07: 75 mL via INTRAVENOUS

## 2023-02-07 MED ORDER — MORPHINE SULFATE (PF) 4 MG/ML IV SOLN
4.0000 mg | Freq: Once | INTRAVENOUS | Status: AC
  Administered 2023-02-07: 4 mg via INTRAVENOUS
  Filled 2023-02-07: qty 1

## 2023-02-07 MED ORDER — METRONIDAZOLE 500 MG PO TABS: 500.0000 mg | ORAL_TABLET | Freq: Four times a day (QID) | ORAL | 0 refills | Status: DC

## 2023-02-07 MED ORDER — DICYCLOMINE HCL 20 MG PO TABS: ORAL_TABLET | ORAL | 0 refills | Status: DC

## 2023-02-07 NOTE — ED Provider Notes (Signed)
[facility] Emergency Department Provider Note MRN:  [id number]  Arrival date & time: 02/07/23     Chief Complaint   Abdominal Pain   History of Present Illness   VERYLE DETHLOFF is a 57 y.o. year-old female with a history of GERD presenting to the ED with chief complaint of abdominal pain.  Epigastric abdominal pain for the past 2 or 3 days.  Occasional radiation to the mid chest.  Described as a burning pressure-like pain.  Seems to be worse with meals.  No fever, no cough, no nausea vomiting or diarrhea.  Review of Systems  A thorough review of systems was obtained and all systems are negative except as noted in the HPI and PMH.   Patient's Health History    Past Medical History:  Diagnosis Date   GERD (gastroesophageal reflux disease)    Hypothyroidism    IBS (irritable bowel syndrome)    S/P colonoscopy 07/13/2010   [place]: no results available. Reportedly done because heme +    Past Surgical History:  Procedure Laterality Date   CESAREAN SECTION     X 2   CHOLECYSTECTOMY     KNEE SURGERY      Family History  Problem Relation Age of Onset   Diabetes Father    Colon cancer Neg Hx     Social History   Socioeconomic History   Marital status: Married    Spouse name: Not on file   Number of children: 2   Years of education: Not on file   Highest education level: Not on file  Occupational History   Occupation: Chief Financial Officer: [employer]    Comment: [place], [place]  Tobacco Use   Smoking status: Never   Smokeless tobacco: Never  Vaping Use   Vaping Use: Never used  Substance and Sexual Activity   Alcohol use: No    Alcohol/week: 0.0 standard drinks of alcohol   Drug use: No   Sexual activity: Yes    Partners: Male    Birth control/protection: None    Comment: spouse  Other Topics Concern   Not on file  Social History Narrative   Not on file   Social Determinants of Health   Financial Resource Strain: Not on file   Food Insecurity: Not on file  Transportation Needs: Not on file  Physical Activity: Not on file  Stress: Not on file  Social Connections: Not on file  Intimate Partner Violence: Not on file     Physical Exam   Vitals:   02/07/23 0545 02/07/23 0600  BP: (!) 153/93 (!) 159/98  Pulse: 82 88  Resp: 17 13  Temp:    SpO2: 98% 97%    CONSTITUTIONAL: Well-appearing, NAD NEURO/PSYCH:  Alert and oriented x 3, no focal deficits EYES:  eyes equal and reactive ENT/NECK:  no LAD, no JVD CARDIO: Regular rate, well-perfused, normal S1 and S2 PULM:  CTAB no wheezing or rhonchi GI/GU:  non-distended, non-tender MSK/SPINE:  No gross deformities, no edema SKIN:  no rash, atraumatic   *Additional and/or pertinent findings included in MDM below  Diagnostic and Interventional Summary    EKG Interpretation  Date/Time:  Monday February 07 2023 04:57:41 EST Ventricular Rate:  90 PR Interval:  157 QRS Duration: 100 QT Interval:  364 QTC Calculation: 446 R Axis:   29 Text Interpretation: Sinus rhythm Abnormal R-wave progression, early transition Baseline wander in lead(s) I aVR Confirmed by Gerlene Fee [phone]) on 02/07/2023 5:34:07 AM  Labs Reviewed  COMPREHENSIVE METABOLIC PANEL - Abnormal; Notable for the following components:      Result Value   Glucose, Bld 122 (*)    All other components within normal limits  CBC - Abnormal; Notable for the following components:   RBC 5.12 (*)    MCH 25.8 (*)    All other components within normal limits  URINALYSIS, ROUTINE W REFLEX MICROSCOPIC - Abnormal; Notable for the following components:   Hgb urine dipstick SMALL (*)    All other components within normal limits  LIPASE, BLOOD  POC URINE PREG, ED  TROPONIN I (HIGH SENSITIVITY)    CT ABDOMEN PELVIS W CONTRAST    (Results Pending)    Medications  famotidine (PEPCID) IVPB 20 mg premix (has no administration in time range)  morphine (PF) 4 MG/ML injection 4 mg (has no  administration in time range)  alum & mag hydroxide-simeth (MAALOX/MYLANTA) 200-200-20 MG/5ML suspension 30 mL (30 mLs Oral Given 02/07/23 0542)     Procedures  /  Critical Care Procedures  ED Course and Medical Decision Making  Initial Impression and Ddx Differential diagnosis includes GERD, pancreatitis, choledocholithiasis.  Patient has a history of cholecystectomy.  Abdomen is soft and nontender, well-appearing with normal vital signs, overall favoring acid related pain and anticipating discharge if labs are reassuring.  Past medical/surgical history that increases complexity of ED encounter: History of GERD  Interpretation of Diagnostics I personally reviewed the EKG and my interpretation is as follows: Sinus rhythm without concerning ischemic features  Labs reassuring with no significant blood count or electrolyte disturbance, first troponin negative.  Patient Reassessment and Ultimate Disposition/Management     Patient with mild relief after Maalox but still complaining of a lot of pain.  We discussed the pros and cons of CT imaging and with shared decision making patient would like a CT scan today.  Will make further attempts at symptomatic management.  Signed out to oncoming provider at shift change.  Patient management required discussion with the following services or consulting groups:  None  Complexity of Problems Addressed Acute illness or injury that poses threat of life of bodily function  Additional Data Reviewed and Analyzed Further history obtained from: Further history from spouse/family member  Additional Factors Impacting ED Encounter Risk Use of parenteral controlled substances  Barth Kirks. Sedonia Small, [place] [email or web address]  Final Clinical Impressions(s) / ED Diagnoses     ICD-10-CM   1. Epigastric pain  R10.13       ED Discharge Orders     None        Discharge Instructions Discussed with  and Provided to Patient:   Discharge Instructions   None      Maudie Flakes, MD 02/07/23 [phone]

## 2023-02-07 NOTE — ED Triage Notes (Addendum)
Pt c/o L sided epigastric pain that woke her from sleep. Described as dull aching pain. Decreased appetite, eating makes pain worse. Hx of hypothyroidism, has not recently had levels checked

## 2023-02-07 NOTE — Discharge Instructions (Signed)
Follow-up with your doctor next week or follow-up with your gastroenterologist

## 2023-02-08 ENCOUNTER — Telehealth: Payer: Self-pay

## 2023-02-08 NOTE — Transitions of Care (Post Inpatient/ED Visit) (Signed)
   02/08/2023  Name: LASHAWN SOKOLOW MRN: [id number] DOB: 09/20/1966  Today's TOC FU Call Status: Today's TOC FU Call Status:: Successful TOC FU Call Competed TOC FU Call Complete Date: 02/08/23  Transition Care Management Follow-up Telephone Call Date of Discharge: 02/07/23 Discharge [facility]: [facility] Type of Discharge: Emergency Department Reason for ED Visit: Other: (epigastric pain) How have you been since you were released from the hospital?: Better Any questions or concerns?: No  Items Reviewed: Did you receive and understand the discharge instructions provided?: Yes Medications obtained and verified?: Yes (Medications Reviewed) Any new allergies since your discharge?: No Dietary orders reviewed?: Yes Do you have support at home?: Yes People in Home: spouse  Home Care and Equipment/Supplies: [place] Ordered?: NA Any new equipment or medical supplies ordered?: NA  Functional Questionnaire: Do you need assistance with bathing/showering or dressing?: No Do you need assistance with meal preparation?: No Do you need assistance with eating?: No Do you have difficulty maintaining continence: No Do you need assistance with getting out of bed/getting out of a chair/moving?: No Do you have difficulty managing or taking your medications?: No  Folllow up appointments reviewed: PCP Follow-up appointment confirmed?: Yes Date of PCP follow-up appointment?: 02/11/23 Follow-up Provider: [facility] Follow-up appointment confirmed?: NA Do you need transportation to your follow-up appointment?: No Do you understand care options if your condition(s) worsen?: Yes-patient verbalized understanding    [place], [place] Nurse Health Advisor Direct Dial [phone]

## 2023-02-09 ENCOUNTER — Encounter (HOSPITAL_COMMUNITY): Payer: Self-pay | Admitting: Emergency Medicine

## 2023-02-09 ENCOUNTER — Other Ambulatory Visit: Payer: Self-pay

## 2023-02-09 ENCOUNTER — Emergency Department (HOSPITAL_COMMUNITY)
Admission: EM | Admit: 2023-02-09 | Discharge: 2023-02-09 | Disposition: A | Discharge status: Home / Self Care | Payer: 59 | Attending: Emergency Medicine | Admitting: Emergency Medicine

## 2023-02-09 ENCOUNTER — Emergency Department (HOSPITAL_COMMUNITY): Payer: 59

## 2023-02-09 DIAGNOSIS — R197 Diarrhea, unspecified: Secondary | ICD-10-CM | POA: Diagnosis present

## 2023-02-09 DIAGNOSIS — K2900 Acute gastritis without bleeding: Secondary | ICD-10-CM

## 2023-02-09 DIAGNOSIS — E039 Hypothyroidism, unspecified: Secondary | ICD-10-CM | POA: Insufficient documentation

## 2023-02-09 LAB — COMPREHENSIVE METABOLIC PANEL
ALT: 70 U/L — ABNORMAL HIGH (ref 0–44)
AST: 41 U/L (ref 15–41)
Albumin: 4.1 g/dL (ref 3.5–5.0)
Alkaline Phosphatase: 135 U/L — ABNORMAL HIGH (ref 38–126)
Anion gap: 8 (ref 5–15)
BUN: 10 mg/dL (ref 6–20)
CO2: 24 mmol/L (ref 22–32)
Calcium: 9.1 mg/dL (ref 8.9–10.3)
Chloride: 104 mmol/L (ref 98–111)
Creatinine, Ser: 0.92 mg/dL (ref 0.44–1.00)
GFR, Estimated: >60 mL/min (ref >60)
Glucose, Bld: 125 mg/dL — ABNORMAL HIGH (ref 70–99)
Potassium: 3.6 mmol/L (ref 3.5–5.1)
Sodium: 136 mmol/L (ref 135–145)
Total Bilirubin: 1.5 mg/dL — ABNORMAL HIGH (ref 0.3–1.2)
Total Protein: 8 g/dL (ref 6.5–8.1)

## 2023-02-09 LAB — CBC WITH DIFFERENTIAL/PLATELET
Abs Immature Granulocytes: 0.02 10*3/uL (ref 0.00–0.07)
Basophils Absolute: 0 10*3/uL (ref 0.0–0.1)
Basophils Relative: 1 %
Eosinophils Absolute: 0 10*3/uL (ref 0.0–0.5)
Eosinophils Relative: 0 %
HCT: 41.5 % (ref 36.0–46.0)
Hemoglobin: 13.5 g/dL (ref 12.0–15.0)
Immature Granulocytes: 0 %
Lymphocytes Relative: 14 %
Lymphs Abs: 1.2 10*3/uL (ref 0.7–4.0)
MCH: 25.8 pg — ABNORMAL LOW (ref 26.0–34.0)
MCHC: 32.5 g/dL (ref 30.0–36.0)
MCV: 79.2 fL — ABNORMAL LOW (ref 80.0–100.0)
Monocytes Absolute: 0.6 10*3/uL (ref 0.1–1.0)
Monocytes Relative: 7 %
Neutro Abs: 6.8 10*3/uL (ref 1.7–7.7)
Neutrophils Relative %: 78 %
Platelets: 323 10*3/uL (ref 150–400)
RBC: 5.24 MIL/uL — ABNORMAL HIGH (ref 3.87–5.11)
RDW: 14.8 % (ref 11.5–15.5)
WBC: 8.8 10*3/uL (ref 4.0–10.5)
nRBC: 0 % (ref 0.0–0.2)

## 2023-02-09 LAB — LIPASE, BLOOD: Lipase: 26 U/L (ref 11–51)

## 2023-02-09 LAB — TROPONIN I (HIGH SENSITIVITY)
Troponin I (High Sensitivity): <2 ng/L (ref <18)
Troponin I (High Sensitivity): <2 ng/L (ref <18)

## 2023-02-09 MED ORDER — ALUM & MAG HYDROXIDE-SIMETH 200-200-20 MG/5ML PO SUSP
30.0000 mL | Freq: Once | ORAL | Status: AC
  Administered 2023-02-09: 30 mL via ORAL
  Filled 2023-02-09: qty 30

## 2023-02-09 MED ORDER — SODIUM CHLORIDE 0.9 % IV BOLUS
500.0000 mL | Freq: Once | INTRAVENOUS | Status: AC
  Administered 2023-02-09: 500 mL via INTRAVENOUS

## 2023-02-09 MED ORDER — ONDANSETRON 4 MG PO TBDP: 4.0000 mg | ORAL_TABLET | Freq: Three times a day (TID) | ORAL | 0 refills | Status: DC | PRN

## 2023-02-09 MED ORDER — DICYCLOMINE HCL 20 MG PO TABS: 20.0000 mg | ORAL_TABLET | Freq: Three times a day (TID) | ORAL | 0 refills | Status: DC | PRN

## 2023-02-09 MED ORDER — PANTOPRAZOLE SODIUM 40 MG IV SOLR
40.0000 mg | Freq: Once | INTRAVENOUS | Status: AC
  Administered 2023-02-09: 40 mg via INTRAVENOUS
  Filled 2023-02-09: qty 10

## 2023-02-09 MED ORDER — LIDOCAINE VISCOUS HCL 2 % MT SOLN
15.0000 mL | Freq: Once | OROMUCOSAL | Status: AC
  Administered 2023-02-09: 15 mL via ORAL
  Filled 2023-02-09: qty 15

## 2023-02-09 MED ORDER — ONDANSETRON HCL 4 MG/2ML IJ SOLN
4.0000 mg | Freq: Once | INTRAMUSCULAR | Status: AC
  Administered 2023-02-09: 4 mg via INTRAVENOUS
  Filled 2023-02-09: qty 2

## 2023-02-09 NOTE — ED Triage Notes (Signed)
C/o nausea, cp, sob. Pt was seen a few days ago in the ed for the same. Pt states when she lays down she feels like she cannot breathe and antibiotics are giving her side effects.

## 2023-02-09 NOTE — ED Notes (Signed)
Patient attempted to provide stool specimen but was not enough for testing.  Patient reports GI cocktail helped with pain initially and pain is starting to return. MD notified.

## 2023-02-09 NOTE — ED Provider Notes (Signed)
Emergency Department Provider Note   I have reviewed the triage vital signs and the nursing notes.   HISTORY  Chief Complaint No chief complaint on file.   HPI [name] is a 57 y.o. female with PMH of GERD and IBS presents to the ED with continued diarrhea and nausea with cramping abdominal pain. She was seen in the ED 2 days prior with CT showing colitis vs underdistension. She was started on Cipro/Flagy and discharged home. She is feeling that her diarrhea is worse than two days ago and last night developed central chest pressure without radiating and SOB.  She states that her pain during her last ED visit did not feel like her typical colitis as it was mainly epigastric and burning in nature. No blood in the stool.    Past Medical History:  Diagnosis Date   GERD (gastroesophageal reflux disease)    Hypothyroidism    IBS (irritable bowel syndrome)    S/P colonoscopy 07/13/2010   [place]: no results available. Reportedly done because heme +    Review of Systems  Constitutional: No fever/chills Cardiovascular: Positive chest pain. Respiratory: Positive shortness of breath. Gastrointestinal: Positive epigastric abdominal pain. Positive nausea, no vomiting.  No diarrhea.  No constipation. Genitourinary: Negative for dysuria. Musculoskeletal: Negative for back pain. Skin: Negative for rash. Neurological: Negative for headaches. ____________________________________________   PHYSICAL EXAM:  VITAL SIGNS: ED Triage Vitals  Enc Vitals Group     BP 02/09/23 0650 (!) 167/102     Pulse Rate 02/09/23 0650 (!) 102     Resp 02/09/23 0650 17     Temp 02/09/23 0648 97.7 F (36.5 C)     Temp Source 02/09/23 0648 Oral     SpO2 02/09/23 0650 99 %     Weight 02/09/23 0648 242 lb 8.1 oz (110 kg)     Height 02/09/23 0648 '5\' 5"'$  (1.651 m)   Constitutional: Alert and oriented. Well appearing and in no acute distress. Eyes: Conjunctivae are normal.  Head: Atraumatic. Nose:  No congestion/rhinnorhea. Mouth/Throat: Mucous membranes are moist.  Neck: No stridor.  Cardiovascular: Normal rate, regular rhythm. Good peripheral circulation. Grossly normal heart sounds.   Respiratory: Normal respiratory effort.  No retractions. Lungs CTAB. Gastrointestinal: Soft with mild left sided tenderness. No rebound or guarding. No distention.  Musculoskeletal: No gross deformities of extremities. Neurologic:  Normal speech and language.  Skin:  Skin is warm, dry and intact. No rash noted.  ____________________________________________   LABS (all labs ordered are listed, but only abnormal results are displayed)  Labs Reviewed  C DIFFICILE QUICK SCREEN W PCR REFLEX    COMPREHENSIVE METABOLIC PANEL  LIPASE, BLOOD  CBC WITH DIFFERENTIAL/PLATELET  TROPONIN I (HIGH SENSITIVITY)   ____________________________________________  EKG   EKG Interpretation  Date/Time:  Wednesday February 09 2023 06:48:50 EST Ventricular Rate:  96 PR Interval:  145 QRS Duration: 81 QT Interval:  345 QTC Calculation: 436 R Axis:   16 Text Interpretation: Sinus rhythm Abnormal R-wave progression, early transition Borderline repolarization abnormality No significant change since last tracing Confirmed by Calvert Cantor [id number]) on 02/09/2023 6:51:23 AM        ____________________________________________  RADIOLOGY  No results found.  ____________________________________________   PROCEDURES  Procedure(s) performed:   Procedures   ____________________________________________   INITIAL IMPRESSION / ASSESSMENT AND PLAN / ED COURSE  Pertinent labs & imaging results that were available during my care of the patient were reviewed by me and considered in my medical decision making (see  chart for details).   This patient is Presenting for Evaluation of CP, which does require a range of treatment options, and is a complaint that involves a high risk of morbidity and mortality.  The  Differential Diagnoses: includes but is not exclusive to acute coronary syndrome, aortic dissection, pulmonary embolism, cardiac tamponade, community-acquired pneumonia, pericarditis, musculoskeletal chest wall pain, etc.   Critical Interventions-    Medications  sodium chloride 0.9 % bolus 500 mL (has no administration in time range)  ondansetron (ZOFRAN) injection 4 mg (has no administration in time range)    Reassessment after intervention:     I did obtain Additional Historical Information from husband at bedside.  I decided to review pertinent External Data, and in summary patient followed by [facility] with referrals to GI ([place]).   Clinical Laboratory Tests Ordered, included CBC without leukocytosis.  No anemia.  Radiologic Tests Ordered, included CXR. I independently interpreted the images and agree with radiology interpretation.   Cardiac Monitor Tracing which shows NSR.    Social Determinants of Health Risk patient is a non-smoker.   Consult complete with  Medical Decision Making: Summary:  Patient presents to the emergency department for evaluation of epigastric abdominal pain with radiation into the chest.  Seems consistent with GI source/GERD.  Equivocal finding on CT from 2 days ago (lightest versus distention).  Does not feel typical of her colitis symptoms and given reassuring labs and CT imaging plan to stop her antibiotics as it does seem to be making things worse.  Will undergo ACS evaluation.  Do not plan to repeat abdominal imaging with overall reassuring exam.  Reevaluation with update and discussion with   ***Considered admission***  Patient's presentation is most consistent with acute presentation with potential threat to life or bodily function.   Disposition:   ____________________________________________  FINAL CLINICAL IMPRESSION(S) / ED DIAGNOSES  Final diagnoses:  None     NEW OUTPATIENT MEDICATIONS STARTED DURING THIS  VISIT:  New Prescriptions   No medications on file    Note:  This document was prepared using Dragon voice recognition software and may include unintentional dictation errors.  Nanda Quinton, MD, [facility]

## 2023-02-09 NOTE — Discharge Instructions (Signed)
You have been seen in the Emergency Department (ED) for abdominal pain.  Your evaluation did not identify a clear cause of your symptoms but was generally reassuring.  Please follow up as instructed above regarding today's emergent visit and the symptoms that are bothering you.  I think you are safe to stop your antibiotics at this point.  They seem to be causing more side effects and your CT scan and overall clinical picture are not as consistent with colitis.  Should you develop lower abdominal pain or fever you should be reevaluated.  Return to the ED if your abdominal pain worsens or fails to improve, you develop bloody vomiting, bloody diarrhea, you are unable to tolerate fluids due to vomiting, fever greater than 101, or other symptoms that concern you.

## 2023-02-10 ENCOUNTER — Telehealth: Payer: Self-pay | Admitting: Internal Medicine

## 2023-02-10 ENCOUNTER — Telehealth: Payer: Self-pay

## 2023-02-10 NOTE — Telephone Encounter (Signed)
Inbound call from patient, states she went to the emergency department twice and was diagnosed with gastritis and told to follow up with Dr. Lorenso Courier. Patient has double procedure scheduled for 4/16 at 3:30 PM. She would like appointment sooner than her procedure. Please advise.

## 2023-02-10 NOTE — Transitions of Care (Post Inpatient/ED Visit) (Signed)
   02/10/2023  Name: [name] MRN: [id number] DOB: 1966/11/05  Today's TOC FU Call Status: Today's TOC FU Call Status:: Successful TOC FU Call Competed TOC FU Call Complete Date: 02/10/23  Transition Care Management Follow-up Telephone Call Date of Discharge: 02/09/23 [facility] Type of Discharge: Emergency Department Reason for ED Visit: Other: (acute gastritis) How have you been since you were released from the hospital?: Better Any questions or concerns?: No  Items Reviewed: Did you receive and understand the discharge instructions provided?: Yes Medications obtained and verified?: Yes (Medications Reviewed) Any new allergies since your discharge?: No Dietary orders reviewed?: Yes Do you have support at home?: Yes  Home Care and Equipment/Supplies: [place] Ordered?: NA Any new equipment or medical supplies ordered?: NA  Functional Questionnaire: Do you need assistance with bathing/showering or dressing?: No Do you need assistance with meal preparation?: No Do you need assistance with eating?: No Do you have difficulty maintaining continence: No Do you need assistance with getting out of bed/getting out of a chair/moving?: No Do you have difficulty managing or taking your medications?: No  Folllow up appointments reviewed: PCP Follow-up appointment confirmed?: Yes Date of PCP follow-up appointment?: 02/11/23 Follow-up Provider: [facility] Follow-up appointment confirmed?: NA Do you need transportation to your follow-up appointment?: No Do you understand care options if your condition(s) worsen?: Yes-patient verbalized understanding    [place], [place] Direct Dial [phone]

## 2023-02-11 ENCOUNTER — Encounter: Payer: Self-pay | Admitting: Nurse Practitioner

## 2023-02-11 ENCOUNTER — Ambulatory Visit: Payer: 59 | Admitting: Nurse Practitioner

## 2023-02-11 VITALS — BP 138/82 | HR 85 | Wt 235.8 lb

## 2023-02-11 DIAGNOSIS — E038 Other specified hypothyroidism: Secondary | ICD-10-CM

## 2023-02-11 DIAGNOSIS — R7303 Prediabetes: Secondary | ICD-10-CM | POA: Diagnosis not present

## 2023-02-11 DIAGNOSIS — K2900 Acute gastritis without bleeding: Secondary | ICD-10-CM

## 2023-02-11 DIAGNOSIS — K2100 Gastro-esophageal reflux disease with esophagitis, without bleeding: Secondary | ICD-10-CM | POA: Diagnosis not present

## 2023-02-11 DIAGNOSIS — K580 Irritable bowel syndrome with diarrhea: Secondary | ICD-10-CM

## 2023-02-11 DIAGNOSIS — E785 Hyperlipidemia, unspecified: Secondary | ICD-10-CM

## 2023-02-11 MED ORDER — LIDOCAINE VISCOUS HCL 2 % MT SOLN: OROMUCOSAL | 0 refills | Status: DC

## 2023-02-11 NOTE — Progress Notes (Signed)
   Subjective:    Patient ID: [name], female    DOB: 06-May-1966, 57 y.o.   MRN: [id number]  HPI Patient arrives today stomach diarrhea, indigestion, upper gastric pain, and not much appetite.    Review of Systems     Objective:   Physical Exam        Assessment & Plan:

## 2023-02-11 NOTE — Telephone Encounter (Signed)
Spoke with the patient. At her first ER visit she was put on antibiotics. In 2 days she was back at the ER with worsening abdominal pain, nausea and loose stools. The antibiotics were stopped. She was given Protonix 40 mg BID, Zofran, Bentyl and told to also take Mylanta. Today she feels a little better. She is glad to be off the antibiotics.  Checked the schedule for a sooner [place] appointment. Patient will keep her appointment as is because there are not any sooner options.

## 2023-02-11 NOTE — Progress Notes (Signed)
Subjective:    Patient ID: [name], female    DOB: 31-Dec-1965, 57 y.o.   MRN: [id number]  HPI Presents today after two ER visits in the past week with complaints of stomach issues. On 02/07/2023, she was having severe, nagging epigastric pain. It was worse with eating, no specific foods.  She was also having trouble swallowing solid foods, so she has only been eating pureed foods. She has not been able to eat much, and feels weak. She said it feels like "food is getting stuck in my throat". She had some nausea as well. They had put her on antibiotics and sent her home. On 02/09/23, she was seen again in the ED with epigastric pain, as well as mid sternal chest pain. She was also having shortness of breath with this as well. She began having loose stools after taking the antibiotics so she stopped taking them. Her stools have been yellow to brown, but they have not been as frequent. The epigastric pain was sharp and it radiated up into her chest. They switched her from Omeprazole and gave her Protonix. She states that the Protonix had been hurting her stomach because she had not been taking it with meals. Once she started with meals, was able to take it twice a day which has helped. She has also been taking Pepto Bismol OTC for relief of diarrhea. She has had her gallbladder removed in the past, and has some diarrhea every now and then. She also does have a history of GERD, and she avoids triggers. She has not eaten any specific foods that would have triggered it. She has not had caffeine or used NSAID's. She has been stressed more than usual. She does have an appointment with gastroenterology for April.   Review of Systems  Constitutional:  Positive for unexpected weight change. Negative for chills, fatigue and fever.       Reports weight loss due to decreased appetite, difficulty swallowing foods and diarrhea.   HENT:  Positive for trouble swallowing.   Respiratory:  Negative for cough, chest  tightness, shortness of breath and wheezing.   Cardiovascular:  Negative for chest pain.       Lower sternal chest pain has resolved.   Gastrointestinal:  Positive for abdominal pain and diarrhea. Negative for blood in stool, constipation, nausea and vomiting.       Positive for diffuse abdominal pain throughout.   Psychiatric/Behavioral:  The patient is nervous/anxious.       Objective:   Physical Exam Vitals and nursing note reviewed.  Constitutional:      General: She is not in acute distress.    Appearance: Normal appearance. She is not ill-appearing.  Cardiovascular:     Rate and Rhythm: Normal rate and regular rhythm.     Heart sounds: Normal heart sounds. No murmur heard. Pulmonary:     Effort: No respiratory distress.     Breath sounds: Normal breath sounds. No wheezing.  Abdominal:     General: Bowel sounds are normal. There is no distension.     Palpations: Abdomen is soft. There is no mass.     Tenderness: There is abdominal tenderness. There is no guarding or rebound.     Comments: Positive for epigastric pain with palpation. No obvious mass. Exam limited due to abdominal girth.   Skin:    General: Skin is warm and dry.  Neurological:     Mental Status: She is alert.  Psychiatric:  Mood and Affect: Mood normal.        Behavior: Behavior normal.        Thought Content: Thought content normal.        Judgment: Judgment normal.    Today's Vitals   02/11/23 0853 02/11/23 0954  BP: (!) 156/99 138/82  Pulse: 85   SpO2: 97%   Weight: 235 lb 12.8 oz (107 kg)    Body mass index is 39.24 kg/m.      02/11/2023    9:01 AM 02/13/2021    1:29 PM 01/09/2020    8:47 AM  GAD 7 : Generalized Anxiety Score  Nervous, Anxious, on Edge '1 1 1  '$ Control/stop worrying 1 0 0  Worry too much - different things 1 0 0  Trouble relaxing 1 0 0  Restless 1 0 0  Easily annoyed or irritable 1 0 0  Afraid - awful might happen 1 0 0  Total GAD 7 Score '7 1 1  '$ Anxiety Difficulty  Somewhat difficult Not difficult at all Not difficult at all      02/11/2023    9:01 AM  Depression screen PHQ 2/9  Decreased Interest 0  Down, Depressed, Hopeless 0  PHQ - 2 Score 0  Altered sleeping 1  Tired, decreased energy 1  Change in appetite 0  Feeling bad or failure about yourself  0  Trouble concentrating 1  Moving slowly or fidgety/restless 1  Suicidal thoughts 0  PHQ-9 Score 4  Difficult doing work/chores Somewhat difficult       Assessment & Plan:  1. Gastroesophageal reflux disease with esophagitis without hemorrhage -Advised patient to continue taking Protonix until her appointment with gastroenterology. Switched to urgent referral to local office per patient request. Educated patient that it may take some time for the medication to work.  Meds ordered this encounter  Medications   lidocaine (XYLOCAINE) 2 % solution    Sig: Take one tsp po TID prn throat/esophageal pain    Dispense:  100 mL    Refill:  0    Order Specific Question:   Supervising Provider    Answer:   Sallee Lange A [[id number]]   -Gave patient instructions to mix one teaspoon of viscous lidocaine with one dose of OTC Mylanta TID.   2. Irritable bowel syndrome with diarrhea -Educated patient to continue Pepto for diarrhea, and warned her about black stools that can occur with medication. Start OTC probiotic as directed.   3. Other specified hypothyroidism - T4, free - TSH  4. Prediabetes - Comprehensive metabolic panel - Hemoglobin A1c  5. Acute gastritis without hemorrhage, unspecified gastritis type -Patient to follow up with gastroenterology as planned.   6. Hyperlipidemia, unspecified hyperlipidemia type - Lipid panel   Educated patient on warning signs including excessive vomiting or coffee ground emesis, bloody or dark stools. Call office or go to ED if these symptoms occur.  Discussed the importance of stress reduction. Defers medication at this time. Had adverse reaction to Prozac in  the past. Call back if she wishes to start medication.   Return if symptoms worsen or fail to improve.

## 2023-02-14 ENCOUNTER — Ambulatory Visit (INDEPENDENT_AMBULATORY_CARE_PROVIDER_SITE_OTHER): Payer: 59 | Admitting: Family Medicine

## 2023-02-14 ENCOUNTER — Encounter: Payer: Self-pay | Admitting: Family Medicine

## 2023-02-14 ENCOUNTER — Other Ambulatory Visit (HOSPITAL_COMMUNITY)
Admission: RE | Admit: 2023-02-14 | Discharge: 2023-02-14 | Disposition: A | Discharge status: Home / Self Care | Payer: 59 | Source: Ambulatory Visit | Attending: Family Medicine | Admitting: Family Medicine

## 2023-02-14 VITALS — BP 133/89 | HR 98 | Temp 98.5°F | Ht 65.0 in | Wt 232.0 lb

## 2023-02-14 DIAGNOSIS — K760 Fatty (change of) liver, not elsewhere classified: Secondary | ICD-10-CM | POA: Insufficient documentation

## 2023-02-14 DIAGNOSIS — K2900 Acute gastritis without bleeding: Secondary | ICD-10-CM | POA: Diagnosis not present

## 2023-02-14 DIAGNOSIS — I700 Atherosclerosis of aorta: Secondary | ICD-10-CM | POA: Insufficient documentation

## 2023-02-14 DIAGNOSIS — R531 Weakness: Secondary | ICD-10-CM | POA: Insufficient documentation

## 2023-02-14 DIAGNOSIS — G4730 Sleep apnea, unspecified: Secondary | ICD-10-CM

## 2023-02-14 LAB — COMPREHENSIVE METABOLIC PANEL
ALT: 35 U/L (ref 0–44)
AST: 28 U/L (ref 15–41)
Albumin: 4.2 g/dL (ref 3.5–5.0)
Alkaline Phosphatase: 112 U/L (ref 38–126)
Anion gap: 10 (ref 5–15)
BUN: 9 mg/dL (ref 6–20)
CO2: 24 mmol/L (ref 22–32)
Calcium: 8.9 mg/dL (ref 8.9–10.3)
Chloride: 101 mmol/L (ref 98–111)
Creatinine, Ser: 0.96 mg/dL (ref 0.44–1.00)
GFR, Estimated: >60 mL/min (ref >60)
Glucose, Bld: 104 mg/dL — ABNORMAL HIGH (ref 70–99)
Potassium: 4 mmol/L (ref 3.5–5.1)
Sodium: 135 mmol/L (ref 135–145)
Total Bilirubin: 1.5 mg/dL — ABNORMAL HIGH (ref 0.3–1.2)
Total Protein: 7.8 g/dL (ref 6.5–8.1)

## 2023-02-14 NOTE — Progress Notes (Signed)
Subjective:  Patient ID: [name], female    DOB: 1966/03/17  Age: 57 y.o. MRN: [id number]  CC: Chief Complaint  Patient presents with   ER follow up     X2 -Gastritis , Weakness, was prescribed abx which gave her diarrhea, not sleeping well restlless believes has sleep apnea    HPI:  57 year old female presents for evaluation of the above.  Patient recently seen on 3/1.  She has had 2 recent ER visits as well for GI symptoms.  Patient reports that her epigastric pain has improved.  She still having some discomfort.  Ongoing diarrhea.  She states that she is now experiencing generalized weakness.  She states that she feels fatigued and feels like her muscles are weak.  She also states that she is concerned that she has sleep apnea.  She states that she has been having difficulty sleeping and wakes up gasping for breath.  Of note, patient recently seen by GI on 2/20.  An additional referral has now been placed due to the fact that she was told the earliest time that she could get endoscopy and colonoscopy was in April.  Patient Active Problem List   Diagnosis Date Noted   Hepatic steatosis 02/14/2023   Aortic atherosclerosis ([place]) 02/14/2023   Generalized weakness 02/14/2023   Sleep apnea 02/14/2023   Irritable bowel syndrome with diarrhea 02/11/2023   Acute gastritis without hemorrhage 11/15/2022   Prediabetes 06/24/2017   Hypothyroidism 04/01/2016   GERD (gastroesophageal reflux disease) 03/10/2013    Social Hx   Social History   Socioeconomic History   Marital status: Married    Spouse name: Not on file   Number of children: 2   Years of education: Not on file   Highest education level: Not on file  Occupational History   Occupation: Chief Financial Officer: [employer]    Comment: [place], [place]  Tobacco Use   Smoking status: Never   Smokeless tobacco: Never  Vaping Use   Vaping Use: Never used  Substance and Sexual Activity   Alcohol use: No    Alcohol/week:  0.0 standard drinks of alcohol   Drug use: No   Sexual activity: Yes    Partners: Male    Birth control/protection: None    Comment: spouse  Other Topics Concern   Not on file  Social History Narrative   Not on file   Social Determinants of Health   Financial Resource Strain: Not on file  Food Insecurity: Not on file  Transportation Needs: Not on file  Physical Activity: Not on file  Stress: Not on file  Social Connections: Not on file    Review of Systems Per HPI  Objective:  BP 133/89   Pulse 98   Temp 98.5 F (36.9 C)   Ht '5\' 5"'$  (1.651 m)   Wt 232 lb (105.2 kg)   LMP 01/11/2016   SpO2 98%   BMI 38.61 kg/m      02/14/2023    4:16 PM 02/14/2023    4:08 PM 02/11/2023    9:54 AM  BP/Weight  Systolic BP [id number]  Diastolic BP 89 95 82  Wt. (Lbs)  232   BMI  38.61 kg/m2     Physical Exam Vitals and nursing note reviewed.  Constitutional:      General: She is not in acute distress.    Appearance: She is obese.  HENT:     Head: Normocephalic and atraumatic.  Cardiovascular:  Rate and Rhythm: Normal rate and regular rhythm.  Pulmonary:     Effort: Pulmonary effort is normal.     Breath sounds: Normal breath sounds.  Abdominal:     General: There is no distension.     Palpations: Abdomen is soft.     Comments: Mild epigastric tenderness.  Neurological:     Mental Status: She is alert.  Psychiatric:     Comments: Flat affect.     Lab Results  Component Value Date   WBC 8.8 02/09/2023   HGB 13.5 02/09/2023   HCT 41.5 02/09/2023   PLT 323 02/09/2023   GLUCOSE 125 (H) 02/09/2023   CHOL 246 (H) 12/24/2021   TRIG 160 (H) 12/24/2021   HDL 53 12/24/2021   LDLCALC 164 (H) 12/24/2021   ALT 70 (H) 02/09/2023   AST 41 02/09/2023   NA 136 02/09/2023   K 3.6 02/09/2023   CL 104 02/09/2023   CREATININE 0.92 02/09/2023   BUN 10 02/09/2023   CO2 24 02/09/2023   TSH 1.910 12/24/2021   HGBA1C 5.6 12/24/2021     Assessment & Plan:   Problem List  Items Addressed This Visit       Respiratory   Sleep apnea    History concerning for sleep apnea.  Referral placed.      Relevant Orders   Ambulatory referral to Pulmonology     Digestive   Acute gastritis without hemorrhage    Currently improved.  Continue Protonix.  Patient has been seen by GI and another referral is in place.        Other   Generalized weakness - Primary    Patient concerned that she has hypokalemia.  Metabolic panel at the hospital today to assess.       Relevant Orders   Comprehensive metabolic panel    Follow-up:  Pending results  [place]

## 2023-02-14 NOTE — Assessment & Plan Note (Signed)
History concerning for sleep apnea.  Referral placed.

## 2023-02-14 NOTE — Assessment & Plan Note (Signed)
Currently improved.  Continue Protonix.  Patient has been seen by GI and another referral is in place.

## 2023-02-14 NOTE — Patient Instructions (Signed)
Continue your current medications.  Lab at the hospital.  We will call with the results.

## 2023-02-14 NOTE — Assessment & Plan Note (Signed)
Patient concerned that she has hypokalemia.  Metabolic panel at the hospital today to assess.

## 2023-02-15 ENCOUNTER — Encounter: Payer: Self-pay | Admitting: Internal Medicine

## 2023-02-15 ENCOUNTER — Other Ambulatory Visit: Payer: Self-pay | Admitting: Family Medicine

## 2023-02-15 ENCOUNTER — Other Ambulatory Visit: Payer: Self-pay

## 2023-02-15 DIAGNOSIS — K219 Gastro-esophageal reflux disease without esophagitis: Secondary | ICD-10-CM

## 2023-02-15 DIAGNOSIS — Z1231 Encounter for screening mammogram for malignant neoplasm of breast: Secondary | ICD-10-CM

## 2023-02-15 DIAGNOSIS — R1310 Dysphagia, unspecified: Secondary | ICD-10-CM

## 2023-02-15 DIAGNOSIS — R1013 Epigastric pain: Secondary | ICD-10-CM

## 2023-02-16 ENCOUNTER — Other Ambulatory Visit: Payer: Self-pay

## 2023-02-16 ENCOUNTER — Emergency Department (HOSPITAL_COMMUNITY): Payer: 59

## 2023-02-16 ENCOUNTER — Emergency Department (HOSPITAL_COMMUNITY)
Admission: EM | Admit: 2023-02-16 | Discharge: 2023-02-16 | Disposition: A | Discharge status: Home / Self Care | Payer: 59 | Attending: Emergency Medicine | Admitting: Emergency Medicine

## 2023-02-16 ENCOUNTER — Encounter: Payer: Self-pay | Admitting: Family Medicine

## 2023-02-16 DIAGNOSIS — R0602 Shortness of breath: Secondary | ICD-10-CM | POA: Insufficient documentation

## 2023-02-16 DIAGNOSIS — E119 Type 2 diabetes mellitus without complications: Secondary | ICD-10-CM | POA: Insufficient documentation

## 2023-02-16 LAB — COMPREHENSIVE METABOLIC PANEL
ALT: 34 U/L (ref 0–44)
AST: 29 U/L (ref 15–41)
Albumin: 4.1 g/dL (ref 3.5–5.0)
Alkaline Phosphatase: 111 U/L (ref 38–126)
Anion gap: 12 (ref 5–15)
BUN: 10 mg/dL (ref 6–20)
CO2: 24 mmol/L (ref 22–32)
Calcium: 9.3 mg/dL (ref 8.9–10.3)
Chloride: 102 mmol/L (ref 98–111)
Creatinine, Ser: 0.91 mg/dL (ref 0.44–1.00)
GFR, Estimated: >60 mL/min (ref >60)
Glucose, Bld: 106 mg/dL — ABNORMAL HIGH (ref 70–99)
Potassium: 4.2 mmol/L (ref 3.5–5.1)
Sodium: 138 mmol/L (ref 135–145)
Total Bilirubin: 2 mg/dL — ABNORMAL HIGH (ref 0.3–1.2)
Total Protein: 8 g/dL (ref 6.5–8.1)

## 2023-02-16 LAB — T4, FREE: Free T4: 1.5 ng/dL — ABNORMAL HIGH (ref 0.61–1.12)

## 2023-02-16 LAB — CBC WITH DIFFERENTIAL/PLATELET
Abs Immature Granulocytes: 0.02 10*3/uL (ref 0.00–0.07)
Basophils Absolute: 0.1 10*3/uL (ref 0.0–0.1)
Basophils Relative: 1 %
Eosinophils Absolute: 0 10*3/uL (ref 0.0–0.5)
Eosinophils Relative: 0 %
HCT: 42.1 % (ref 36.0–46.0)
Hemoglobin: 13.3 g/dL (ref 12.0–15.0)
Immature Granulocytes: 0 %
Lymphocytes Relative: 16 %
Lymphs Abs: 1.2 10*3/uL (ref 0.7–4.0)
MCH: 25.8 pg — ABNORMAL LOW (ref 26.0–34.0)
MCHC: 31.6 g/dL (ref 30.0–36.0)
MCV: 81.7 fL (ref 80.0–100.0)
Monocytes Absolute: 0.7 10*3/uL (ref 0.1–1.0)
Monocytes Relative: 9 %
Neutro Abs: 5.5 10*3/uL (ref 1.7–7.7)
Neutrophils Relative %: 74 %
Platelets: 299 10*3/uL (ref 150–400)
RBC: 5.15 MIL/uL — ABNORMAL HIGH (ref 3.87–5.11)
RDW: 14.6 % (ref 11.5–15.5)
WBC: 7.5 10*3/uL (ref 4.0–10.5)
nRBC: 0 % (ref 0.0–0.2)

## 2023-02-16 LAB — D-DIMER, QUANTITATIVE: D-Dimer, Quant: <0.27 ug/mL-FEU (ref 0.00–0.50)

## 2023-02-16 LAB — TSH: TSH: 2.951 u[IU]/mL (ref 0.350–4.500)

## 2023-02-16 LAB — MAGNESIUM: Magnesium: 2.2 mg/dL (ref 1.7–2.4)

## 2023-02-16 MED ORDER — FAMOTIDINE 20 MG PO TABS: 20.0000 mg | ORAL_TABLET | Freq: Two times a day (BID) | ORAL | 0 refills | Status: AC

## 2023-02-16 MED ORDER — ACETAZOLAMIDE 125 MG PO TABS: 125.0000 mg | ORAL_TABLET | Freq: Three times a day (TID) | ORAL | 0 refills | Status: DC

## 2023-02-16 MED ORDER — SUCRALFATE 1 G PO TABS: 1.0000 g | ORAL_TABLET | Freq: Three times a day (TID) | ORAL | 0 refills | Status: DC

## 2023-02-16 NOTE — ED Triage Notes (Signed)
Pt c/o sob that started last Monday that is worse when she sleeps, states she has been here 3 times in the last week in a half for similar Sx. States she was Dx with gastritis the second time she came here, endorses lightheadedness, denies CP, denies N/V

## 2023-02-16 NOTE — Discharge Instructions (Signed)
As discussed, today's evaluation has been generally reassuring.  It is important that you keep your follow-up appointments with your pulmonologist and gastroenterologist.  In addition, please contact your primary care physician and let them know that you were seen again in the emergency department and are starting a new medication regimen with consideration of your persistent difficulty breathing/sleeping.  Discussed your new medications with your physician.  Return here for concerning changes in your condition.

## 2023-02-16 NOTE — ED Provider Notes (Signed)
[facility] Provider Note   CSN: [id number] Arrival date & time: 02/16/23  0703     History  Chief Complaint  Patient presents with   Shortness of Breath    [name] is a 57 y.o. female.  HPI Patient presents with her husband who assists with the history.  In essence the patient presents with concern for slightly more than 1 week of dyspnea.  Specifically, the patient feels as though when she is about to fall sleep or resting supine she feels short of breath suddenly, and this sensation lasts for several moments.  Sensation is prohibitive for sleeping and she did not sleep at all last night.  She has been seen, evaluated, 3 prior times in the emergency department has seen her primary care physician, and is scheduled for endoscopy in 2 weeks, sleep study also in 2 weeks. She states that she is generally well, functional, working. She does have a history of gastritis, and after her first ED visit last week started on antibiotics for possible colitis.  She notes that she had diarrhea, those antibiotics were stopped, and after repeat evaluation with consideration of gastritis she switched her antacid regimen.  She presents today due to a particularly uncomfortable night, without current pain, but with weakness diffusely.     Home Medications Prior to Admission medications   Medication Sig Start Date End Date Taking? Authorizing Provider  ALPRAZolam Duanne Moron) 0.5 MG tablet Take 0.5 mg by mouth at bedtime as needed for anxiety.   Yes [provider]  pantoprazole (PROTONIX) 40 MG tablet Take 40 mg by mouth 2 (two) times daily.   Yes [provider]  SYNTHROID 100 MCG tablet TAKE 1 TABLET(100 MCG) BY MOUTH DAILY 12/29/22  Yes Lacinda Axon, Jayce G, DO  lidocaine (XYLOCAINE) 2 % solution Take one tsp po TID prn throat/esophageal pain Patient not taking: Reported on 02/16/2023 02/11/23   Nilda Simmer, NP      Allergies     Erythromycin and Amoxil [amoxicillin]    Review of Systems   Review of Systems  All other systems reviewed and are negative.   Physical Exam Updated Vital Signs BP (!) 154/98   Pulse 90   Temp 98.1 F (36.7 C) (Oral)   Resp 20   Ht '5\' 5"'$  (1.651 m)   Wt 104.3 kg   LMP 01/11/2016   SpO2 100%   BMI 38.27 kg/m  Physical Exam Vitals and nursing note reviewed.  Constitutional:      General: She is not in acute distress.    Appearance: She is well-developed. She is obese. She is not ill-appearing or diaphoretic.  HENT:     Head: Normocephalic and atraumatic.  Eyes:     Conjunctiva/sclera: Conjunctivae normal.  Cardiovascular:     Rate and Rhythm: Normal rate and regular rhythm.  Pulmonary:     Effort: Pulmonary effort is normal. No respiratory distress.     Breath sounds: No stridor.  Abdominal:     General: There is no distension.  Skin:    General: Skin is warm and dry.  Neurological:     Mental Status: She is alert and oriented to person, place, and time.     Cranial Nerves: No cranial nerve deficit.  Psychiatric:        Mood and Affect: Mood normal.     ED Results / Procedures / Treatments   Labs (all labs ordered are listed, but only abnormal results  are displayed) Labs Reviewed  COMPREHENSIVE METABOLIC PANEL - Abnormal; Notable for the following components:      Result Value   Glucose, Bld 106 (*)    Total Bilirubin 2.0 (*)    All other components within normal limits  CBC WITH DIFFERENTIAL/PLATELET - Abnormal; Notable for the following components:   RBC 5.15 (*)    MCH 25.8 (*)    All other components within normal limits  D-DIMER, QUANTITATIVE  MAGNESIUM  TSH  T4, FREE    EKG EKG Interpretation  Date/Time:  Wednesday February 16 2023 07:17:06 EST Ventricular Rate:  94 PR Interval:  154 QRS Duration: 91 QT Interval:  363 QTC Calculation: 454 R Axis:   36 Text Interpretation: Sinus rhythm Low voltage, precordial leads Abnormal R-wave  progression, early transition Borderline T abnormalities, anterior leads Abnormal ECG Confirmed by Carmin Muskrat [id number]) on 02/16/2023 7:37:50 AM  Radiology DG Neck Soft Tissue  Result Date: 02/16/2023 CLINICAL DATA:  Short of breath EXAM: NECK SOFT TISSUES - 1+ VIEW COMPARISON:  None Available. FINDINGS: There is no evidence of retropharyngeal soft tissue swelling or epiglottic enlargement. The cervical airway is unremarkable and no radio-opaque foreign body identified. IMPRESSION: No acute abnormality. Electronically Signed   By: Suzy Bouchard M.D.   On: 02/16/2023 08:08   DG Chest 2 View  Result Date: 02/16/2023 CLINICAL DATA:  Short of breath EXAM: CHEST - 2 VIEW COMPARISON:  None Available. FINDINGS: Normal mediastinum and cardiac silhouette. Normal pulmonary vasculature. No evidence of effusion, infiltrate, or pneumothorax. No acute bony abnormality. IMPRESSION: No acute cardiopulmonary process. Electronically Signed   By: Suzy Bouchard M.D.   On: 02/16/2023 08:04    Procedures Procedures    Medications Ordered in ED Medications - No data to display  ED Course/ Medical Decision Making/ A&P                             Medical Decision Making Patient with multiple medical issues, including obesity, diabetes, gastritis, now presents with ongoing apnea.  Here patient is awake, alert, sitting upright.  She is mildly hypertensive, but otherwise hemodynamically unremarkable.  Patient has had thorough evaluation in the past few visits, differential remains, gastritis with secondary esophageal irritation, soft tissue lesion, less likely pulmonary embolism, sleep apnea is a symptoms seem to begin when she is about to sleep not during sleep.  Patient also with history of anemia, and with endocrine considerations additional studies beyond labs, x-ray were performed. Patient placed on continuous cardiac monitoring, pulse oximetry. Cardiac 90 sinus normal Pulse ox 100% room air  normal   Amount and/or Complexity of Data Reviewed Independent Historian: spouse External Data Reviewed: notes.    Details: Ultimately ED visits and primary care/GI visits with prior evaluation for anemia, recognized. Labs: ordered. Decision-making details documented in ED Course. Radiology: ordered and independent interpretation performed. ECG/medicine tests: ordered and independent interpretation performed. Decision-making details documented in ED Course.   10:17 AM Patient awake, alert, and noted stress, she has been monitored for hours without decompensation.  I reviewed her labs, x-ray, discussed them with her and her husband.  No evidence for bacteremia, sepsis, electrolyte abnormalities, intrathoracic or cervical spine abnormalities.  We discussed possibilities, as above, the patient is appropriate for continued outpatient evaluation of her episodic apnea given today's reassuring findings.  We discussed possible intervention to try pending outpatient follow-up, the patient is amenable to this.  Patient discharged in stable condition.  Final Clinical Impression(s) / ED Diagnoses Final diagnoses:  SOB (shortness of breath)    Rx / DC Orders ED Discharge Orders     None         Carmin Muskrat, MD 02/16/23 1019

## 2023-02-17 ENCOUNTER — Telehealth: Payer: Self-pay

## 2023-02-17 NOTE — Transitions of Care (Post Inpatient/ED Visit) (Signed)
   02/17/2023  Name: [name] MRN: [id number] DOB: 1966/01/15  Today's TOC FU Call Status: Today's TOC FU Call Status:: Successful TOC FU Call Competed TOC FU Call Complete Date: 02/17/23  Transition Care Management Follow-up Telephone Call Date of Discharge: 02/16/23 Discharge [facility] Type of Discharge: Emergency Department Reason for ED Visit: Other: (shortness of breath) How have you been since you were released from the hospital?: Better Any questions or concerns?: No  Items Reviewed: Did you receive and understand the discharge instructions provided?: Yes Medications obtained and verified?: Yes (Medications Reviewed) Any new allergies since your discharge?: No Dietary orders reviewed?: Yes Do you have support at home?: Yes People in Home: spouse  Home Care and Equipment/Supplies: [place] Ordered?: NA Any new equipment or medical supplies ordered?: NA  Functional Questionnaire: Do you need assistance with bathing/showering or dressing?: No Do you need assistance with meal preparation?: No Do you need assistance with eating?: No Do you have difficulty maintaining continence: No Do you need assistance with getting out of bed/getting out of a chair/moving?: No Do you have difficulty managing or taking your medications?: No  Folllow up appointments reviewed: PCP Follow-up appointment confirmed?: NA Specialist Hospital Follow-up appointment confirmed?: Yes Date of Specialist follow-up appointment?: 03/03/23 Follow-Up Specialty Provider:: Dr Lorenso Courier Do you need transportation to your follow-up appointment?: No Do you understand care options if your condition(s) worsen?: Yes-patient verbalized understanding    [place], [place] Nurse Health Advisor Direct Dial [phone]

## 2023-02-25 ENCOUNTER — Ambulatory Visit (INDEPENDENT_AMBULATORY_CARE_PROVIDER_SITE_OTHER): Payer: 59 | Admitting: Nurse Practitioner

## 2023-02-25 VITALS — BP 138/89 | Ht 65.0 in | Wt 230.0 lb

## 2023-02-25 DIAGNOSIS — R42 Dizziness and giddiness: Secondary | ICD-10-CM

## 2023-02-25 DIAGNOSIS — Z13220 Encounter for screening for lipoid disorders: Secondary | ICD-10-CM

## 2023-02-25 DIAGNOSIS — R5383 Other fatigue: Secondary | ICD-10-CM

## 2023-02-25 DIAGNOSIS — R7303 Prediabetes: Secondary | ICD-10-CM | POA: Diagnosis not present

## 2023-02-25 NOTE — Progress Notes (Signed)
   Subjective:    Patient ID: [name], female    DOB: 04-Jul-1966, 57 y.o.   MRN: [id number]  HPI  Patient arrives for a follow up from a recent ER visit for SOB and stomach issues Patient stated she is still having issues with dizziness- not sure if related to fact she can't eat much with her stomach. Patient has endoscopy scheduled next week  Review of Systems     Objective:   Physical Exam        Assessment & Plan:

## 2023-02-25 NOTE — Progress Notes (Unsigned)
Subjective:    Patient ID: [name], female    DOB: [birth year], 57 y.o.   MRN: [id number]  HPI Minie is being seen today for a follow-up after an emergency room visit on 02/16/23 for shortness of breath. Patient also complains of lightheadedness, fatigue, and dark green stools. She has had lightheadedness and fatigue for the past few days. No syncope or sinus signs and symptoms. It is not relieved by standing or sitting down. Patient describes it as she "just feels off". No vision changes or numbness. Dark green stools have been occurring for the past week. She has had no dietary changes. No complaints of loose stools.   Review of Systems  Constitutional:  Negative for appetite change, chills, fatigue and fever.  HENT:  Negative for ear pain and sinus pressure.   Respiratory:  Negative for cough, choking, chest tightness and shortness of breath.   Cardiovascular:  Negative for chest pain and palpitations.  Gastrointestinal:        Positive for dark green stools.   Neurological:  Positive for light-headedness. Negative for dizziness, syncope, facial asymmetry, speech difficulty, weakness and numbness.  Psychiatric/Behavioral:  Negative for sleep disturbance. The patient is not nervous/anxious.       Objective:   Physical Exam Vitals and nursing note reviewed.  Constitutional:      General: She is not in acute distress.    Appearance: Normal appearance. She is not ill-appearing.  HENT:     Right Ear: Tympanic membrane, ear canal and external ear normal.     Left Ear: Tympanic membrane, ear canal and external ear normal.     Mouth/Throat:     Mouth: Mucous membranes are moist.     Pharynx: Oropharynx is clear.  Cardiovascular:     Rate and Rhythm: Normal rate and regular rhythm.     Heart sounds: Normal heart sounds. No murmur heard. Abdominal:     General: There is no distension.     Palpations: Abdomen is soft. There is no mass.     Tenderness: There is no abdominal  tenderness.  Lymphadenopathy:     Cervical: No cervical adenopathy.  Neurological:     Mental Status: She is alert.     Coordination: Romberg sign negative.     Deep Tendon Reflexes:     Reflex Scores:      Patellar reflexes are 2+ on the right side.      Achilles reflexes are 2+ on the right side. Psychiatric:        Mood and Affect: Mood normal.        Behavior: Behavior normal.        Thought Content: Thought content normal.        Judgment: Judgment normal.    Today's Vitals   02/25/23 1502  BP: 138/89  Weight: 230 lb (104.3 kg)  Height: 5\' 5"  (1.651 m)   Orthostatic VS for the past 72 hrs (Last 3 readings):  Orthostatic BP Patient Position BP Location  02/25/23 1543 122/88 Standing Right Arm  02/25/23 1542 128/88 Supine Right Arm  02/25/23 1541 132/88 Sitting Right Arm    Body mass index is 38.27 kg/m.     02/11/2023    9:01 AM 11/15/2022    9:19 AM 12/23/2021    2:06 PM 07/27/2021   10:23 AM 02/13/2021    1:11 PM  Depression screen PHQ 2/9  Decreased Interest 0 0 0 0 0  Down, Depressed, Hopeless 0 0 0 0 0  PHQ - 2 Score 0 0 0 0 0  Altered sleeping 1      Tired, decreased energy 1      Change in appetite 0      Feeling bad or failure about yourself  0      Trouble concentrating 1      Moving slowly or fidgety/restless 1      Suicidal thoughts 0      PHQ-9 Score 4      Difficult doing work/chores Somewhat difficult           02/11/2023    9:01 AM 02/13/2021    1:29 PM 01/09/2020    8:47 AM  GAD 7 : Generalized Anxiety Score  Nervous, Anxious, on Edge 1 1 1   Control/stop worrying 1 0 0  Worry too much - different things 1 0 0  Trouble relaxing 1 0 0  Restless 1 0 0  Easily annoyed or irritable 1 0 0  Afraid - awful might happen 1 0 0  Total GAD 7 Score 7 1 1   Anxiety Difficulty Somewhat difficult Not difficult at all Not difficult at all      Assessment & Plan:  1. Prediabetes  - Hemoglobin A1c  2. Screening for lipid disorders  - Lipid panel  3.  Fatigue, unspecified type Fatigue and lightheadedness may be related to dietary changes due to GI issues. Patient defers medication for stress/anxiety. Thinks most of the issues are due to her GI problems.   4. Lightheadedness Reviewed warning signs. Seek help if any problems.   Contact the office if further issues. Patient to discuss stool color and need for screening colonoscopy with her GI specialist.

## 2023-02-26 ENCOUNTER — Encounter: Payer: Self-pay | Admitting: Nurse Practitioner

## 2023-03-02 ENCOUNTER — Ambulatory Visit (INDEPENDENT_AMBULATORY_CARE_PROVIDER_SITE_OTHER): Payer: 59 | Admitting: Pulmonary Disease

## 2023-03-02 ENCOUNTER — Encounter (HOSPITAL_BASED_OUTPATIENT_CLINIC_OR_DEPARTMENT_OTHER): Payer: Self-pay | Admitting: Pulmonary Disease

## 2023-03-02 VITALS — BP 122/80 | HR 71 | Ht 65.0 in | Wt 227.2 lb

## 2023-03-02 DIAGNOSIS — G4733 Obstructive sleep apnea (adult) (pediatric): Secondary | ICD-10-CM

## 2023-03-02 DIAGNOSIS — R0683 Snoring: Secondary | ICD-10-CM | POA: Diagnosis not present

## 2023-03-02 NOTE — Progress Notes (Signed)
Subjective:    Patient ID: [name], female    DOB: Jul 22, 1966, 57 y.o.   MRN: [id number]  HPI  Chief Complaint  Patient presents with   sleep consult    Pt states that she has been told that she snores at night. States that some mornings she wakes up feeling like she has not slept at all.   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 She had an ED visit for chest pain which was attributed to GI issues.  She was felt to have gastritis and hiatal hernia.  EGD and colonoscopy has been scheduled.  This has been an anxious time for her and she reports episodes where she wakes up from sleep and shortness of breath.  She finds it hard to breathe through her nose when laying down but this has been going on for years. Husband has reported loud snoring for many years but has not witnessed apneas.  Epworth sleepiness score is 3 Non-Pseudo denies significant somnolence in the daytime. Bedtime is around 10 PM, sleep latency about 30 minutes, she reports 1-2 nocturnal awakenings and is out of bed at 6 AM feeling tired with dryness of mouth, no headaches. Weight has fluctuated within 5 pounds There is no history suggestive of cataplexy, sleep paralysis or parasomnias    Past Medical History:  Diagnosis Date   GERD (gastroesophageal reflux disease)    Hypothyroidism    IBS (irritable bowel syndrome)    S/P colonoscopy 07/13/2010   [place]: no results available. Reportedly done because heme +   Past Surgical History:  Procedure Laterality Date   CESAREAN SECTION     X 2   CHOLECYSTECTOMY     KNEE SURGERY      Allergies  Allergen Reactions   Erythromycin Hives   Amoxil [Amoxicillin] Rash    Rash on palms of hands; peeling of hands     Social History   Socioeconomic History   Marital status: Married    Spouse name: Not on file   Number of children: 2   Years of education: Not on file   Highest education level: Not on file   Occupational History   Occupation: Chief Financial Officer: [employer]    Comment: [place], [place]  Tobacco Use   Smoking status: Never   Smokeless tobacco: Never  Vaping Use   Vaping Use: Never used  Substance and Sexual Activity   Alcohol use: No    Alcohol/week: 0.0 standard drinks of alcohol   Drug use: No   Sexual activity: Yes    Partners: Male    Birth control/protection: None    Comment: spouse  Other Topics Concern   Not on file  Social History Narrative   Not on file   Social Determinants of Health   Financial Resource Strain: Not on file  Food Insecurity: Not on file  Transportation Needs: Not on file  Physical Activity: Not on file  Stress: Not on file  Social Connections: Not on file  Intimate Partner Violence: Not on file    Family History  Problem Relation Age of Onset   Diabetes Father    Colon cancer Neg Hx      Review of Systems  Constitutional: negative for anorexia, fevers and sweats  Eyes: negative for irritation, redness and visual disturbance  Ears, nose, mouth, throat, and face: negative for earaches, epistaxis, nasal congestion and sore throat  Respiratory: negative for cough, dyspnea on exertion,  sputum and wheezing  Cardiovascular: negative for chest pain, dyspnea, lower extremity edema, orthopnea, palpitations and syncope  Gastrointestinal: negative for abdominal pain, constipation, diarrhea, melena, nausea and vomiting  Genitourinary:negative for dysuria, frequency and hematuria  Hematologic/lymphatic: negative for bleeding, easy bruising and lymphadenopathy  Musculoskeletal:negative for arthralgias, muscle weakness and stiff joints  Neurological: negative for coordination problems, gait problems, headaches and weakness  Endocrine: negative for diabetic symptoms including polydipsia, polyuria and weight loss     Objective:   Physical Exam  Gen. Pleasant, obese, in no distress ENT - no lesions, no post nasal drip Neck: No JVD, no  thyromegaly, no carotid bruits Lungs: no use of accessory muscles, no dullness to percussion, decreased without rales or rhonchi  Cardiovascular: Rhythm regular, heart sounds  normal, no murmurs or gallops, no peripheral edema Musculoskeletal: No deformities, no cyanosis or clubbing , no tremors       Assessment & Plan:

## 2023-03-02 NOTE — Patient Instructions (Signed)
?  X Home sleep study ?

## 2023-03-02 NOTE — Assessment & Plan Note (Signed)
Concern here is for sleep apnea given her loud snoring, episodes of dyspnea during her sleep.  Pretest probability is intermediate She does have considerable anxiety around her current GI symptoms and this could be the differential. We will investigate with a home sleep test, she does not have any comorbidities and has a good cardiovascular profile so we would only treat if she has more than mild OSA  The pathophysiology of obstructive sleep apnea , it's cardiovascular consequences & modes of treatment including CPAP were discused with the patient in detail & they evidenced understanding.

## 2023-03-03 ENCOUNTER — Ambulatory Visit (AMBULATORY_SURGERY_CENTER): Payer: 59 | Admitting: Internal Medicine

## 2023-03-03 ENCOUNTER — Encounter: Payer: Self-pay | Admitting: Internal Medicine

## 2023-03-03 VITALS — BP 181/96 | HR 82 | Temp 98.7°F | Resp 14 | Ht 65.0 in | Wt 243.0 lb

## 2023-03-03 DIAGNOSIS — R1310 Dysphagia, unspecified: Secondary | ICD-10-CM

## 2023-03-03 DIAGNOSIS — K317 Polyp of stomach and duodenum: Secondary | ICD-10-CM

## 2023-03-03 DIAGNOSIS — K3189 Other diseases of stomach and duodenum: Secondary | ICD-10-CM

## 2023-03-03 MED ORDER — SODIUM CHLORIDE 0.9 % IV SOLN: 500.0000 mL | Freq: Once | INTRAVENOUS | Status: DC

## 2023-03-03 NOTE — Progress Notes (Signed)
A/O x 3, gd SR's, VSS, report to RN

## 2023-03-03 NOTE — Progress Notes (Signed)
Called to room to assist during endoscopic procedure.  Patient ID and intended procedure confirmed with present staff. Received instructions for my participation in the procedure from the performing physician.  

## 2023-03-03 NOTE — Progress Notes (Signed)
GASTROENTEROLOGY PROCEDURE H&P NOTE   Primary Care Physician: Coral Spikes, DO    Reason for Procedure:   Dysphagia, epigastric ab pain  Plan:    EGD  Patient is appropriate for endoscopic procedure(s) in the ambulatory ([place]) setting.  The nature of the procedure, as well as the risks, benefits, and alternatives were carefully and thoroughly reviewed with the patient. Ample time for discussion and questions allowed. The patient understood, was satisfied, and agreed to proceed.     HPI: [name] is a 57 y.o. female who presents for EGD for evaluation of dysphagia and epigastric ab pain .  Patient was most recently seen in the Gastroenterology Clinic on 02/01/23.  No interval change in medical history since that appointment. Please refer to that note for full details regarding GI history and clinical presentation.   Past Medical History:  Diagnosis Date   GERD (gastroesophageal reflux disease)    Hypothyroidism    IBS (irritable bowel syndrome)    S/P colonoscopy 07/13/2010   [place]: no results available. Reportedly done because heme +    Past Surgical History:  Procedure Laterality Date   CESAREAN SECTION     X 2   CHOLECYSTECTOMY     KNEE SURGERY      Prior to Admission medications   Medication Sig Start Date End Date Taking? Authorizing Provider  ALPRAZolam Duanne Moron) 0.5 MG tablet Take 0.5 mg by mouth at bedtime as needed for anxiety.   Yes [provider]  pantoprazole (PROTONIX) 40 MG tablet Take 40 mg by mouth daily.   Yes [provider]  sucralfate (CARAFATE) 1 g tablet Take 1 tablet (1 g total) by mouth 4 (four) times daily -  with meals and at bedtime. 02/16/23  Yes Carmin Muskrat, MD  SYNTHROID 100 MCG tablet TAKE 1 TABLET(100 MCG) BY MOUTH DAILY 12/29/22  Yes Cook, Jayce G, DO  famotidine (PEPCID) 20 MG tablet Take 1 tablet (20 mg total) by mouth 2 (two) times daily. 02/16/23   Carmin Muskrat, MD  lidocaine (XYLOCAINE) 2 % solution  Take one tsp po TID prn throat/esophageal pain Patient not taking: Reported on 03/02/2023 02/11/23   Nilda Simmer, NP    Current Outpatient Medications  Medication Sig Dispense Refill   ALPRAZolam (XANAX) 0.5 MG tablet Take 0.5 mg by mouth at bedtime as needed for anxiety.     pantoprazole (PROTONIX) 40 MG tablet Take 40 mg by mouth daily.     sucralfate (CARAFATE) 1 g tablet Take 1 tablet (1 g total) by mouth 4 (four) times daily -  with meals and at bedtime. 21 tablet 0   SYNTHROID 100 MCG tablet TAKE 1 TABLET(100 MCG) BY MOUTH DAILY 90 tablet 0   famotidine (PEPCID) 20 MG tablet Take 1 tablet (20 mg total) by mouth 2 (two) times daily. 30 tablet 0   lidocaine (XYLOCAINE) 2 % solution Take one tsp po TID prn throat/esophageal pain (Patient not taking: Reported on 03/02/2023) 100 mL 0   Current Facility-Administered Medications  Medication Dose Route Frequency Provider Last Rate Last Admin   0.9 %  sodium chloride infusion  500 mL Intravenous Once Sharyn Creamer, MD        Allergies as of 03/03/2023 - Review Complete 03/03/2023  Allergen Reaction Noted   Erythromycin Hives 04/08/2011   Amoxil [amoxicillin] Rash 02/28/2013    Family History  Problem Relation Age of Onset   Diabetes Father    Colon cancer Neg Hx  Social History   Socioeconomic History   Marital status: Married    Spouse name: Not on file   Number of children: 2   Years of education: Not on file   Highest education level: Not on file  Occupational History   Occupation: Chief Financial Officer: [employer]    Comment: [place], [place]  Tobacco Use   Smoking status: Never   Smokeless tobacco: Never  Vaping Use   Vaping Use: Never used  Substance and Sexual Activity   Alcohol use: No    Alcohol/week: 0.0 standard drinks of alcohol   Drug use: No   Sexual activity: Yes    Partners: Male    Birth control/protection: None    Comment: spouse  Other Topics Concern   Not on file  Social History Narrative    Not on file   Social Determinants of Health   Financial Resource Strain: Not on file  Food Insecurity: Not on file  Transportation Needs: Not on file  Physical Activity: Not on file  Stress: Not on file  Social Connections: Not on file  Intimate Partner Violence: Not on file    Physical Exam: Vital signs in last 24 hours: BP (!) 163/97   Pulse 80   Temp 98.7 F (37.1 C)   Ht 5\' 5"  (1.651 m)   Wt 243 lb (110.2 kg)   LMP 01/11/2016   SpO2 100%   BMI 40.44 kg/m  GEN: NAD EYE: Sclerae anicteric ENT: MMM CV: Non-tachycardic Pulm: No increased WOB GI: Soft NEURO:  Alert & Oriented   Christia Reading, MD [facility]   03/03/2023 11:14 AM

## 2023-03-03 NOTE — Progress Notes (Signed)
VS by EC  Pt's states no medical or surgical changes since previsit or office visit.  

## 2023-03-03 NOTE — Patient Instructions (Signed)
Handout provided on hiatal hernia and gastritis.   Await pathology results.  Can consider starting on Pantoprazole 40mg  twice daily in the future if Pepcid and Sucralfate is inadequate for pain control.   YOU HAD AN ENDOSCOPIC PROCEDURE TODAY AT [facility]:   Refer to the procedure report that was given to you for any specific questions about what was found during the examination.  If the procedure report does not answer your questions, please call your gastroenterologist to clarify.  If you requested that your care partner not be given the details of your procedure findings, then the procedure report has been included in a sealed envelope for you to review at your convenience later.  YOU SHOULD EXPECT: Some feelings of bloating in the abdomen. Passage of more gas than usual.  Walking can help get rid of the air that was put into your GI tract during the procedure and reduce the bloating. If you had a lower endoscopy (such as a colonoscopy or flexible sigmoidoscopy) you may notice spotting of blood in your stool or on the toilet paper. If you underwent a bowel prep for your procedure, you may not have a normal bowel movement for a few days.  Please Note:  You might notice some irritation and congestion in your nose or some drainage.  This is from the oxygen used during your procedure.  There is no need for concern and it should clear up in a day or so.  SYMPTOMS TO REPORT IMMEDIATELY:  Following upper endoscopy (EGD)  Vomiting of blood or coffee ground material  New chest pain or pain under the shoulder blades  Painful or persistently difficult swallowing  New shortness of breath  Fever of 100F or higher  Black, tarry-looking stools  For urgent or emergent issues, a gastroenterologist can be reached at any hour by calling [phone]. Do not use MyChart messaging for urgent concerns.    DIET:  We do recommend a small meal at first, but then you may proceed to your  regular diet.  Drink plenty of fluids but you should avoid alcoholic beverages for 24 hours.  ACTIVITY:  You should plan to take it easy for the rest of today and you should NOT DRIVE or use heavy machinery until tomorrow (because of the sedation medicines used during the test).    FOLLOW UP: Our staff will call the number listed on your records the next business day following your procedure.  We will call around 7:15- 8:00 am to check on you and address any questions or concerns that you may have regarding the information given to you following your procedure. If we do not reach you, we will leave a message.     If any biopsies were taken you will be contacted by phone or by letter within the next 1-3 weeks.  Please call us at [phone] if you have not heard about the biopsies in 3 weeks.    SIGNATURES/CONFIDENTIALITY: You and/or your care partner have signed paperwork which will be entered into your electronic medical record.  These signatures attest to the fact that that the information above on your After Visit Summary has been reviewed and is understood.  Full responsibility of the confidentiality of this discharge information lies with you and/or your care-partner.

## 2023-03-03 NOTE — Op Note (Signed)
[facility] Patient Name: [name] Procedure Date: 03/03/2023 11:25 AM MRN: [id number] Endoscopist: Adline Mango Amery , , [id number] Age: 57 Referring MD:  Date of Birth: 1966/04/21 Gender: Female Account #: [id number] Procedure:                Upper GI endoscopy Indications:              Epigastric abdominal pain, Dysphagia, Heartburn Medicines:                Monitored Anesthesia Care Procedure:                Pre-Anesthesia Assessment:                           - Prior to the procedure, a History and Physical                            was performed, and patient medications and                            allergies were reviewed. The patient's tolerance of                            previous anesthesia was also reviewed. The risks                            and benefits of the procedure and the sedation                            options and risks were discussed with the patient.                            All questions were answered, and informed consent                            was obtained. Prior Anticoagulants: The patient has                            taken no anticoagulant or antiplatelet agents. ASA                            Grade Assessment: III - A patient with severe                            systemic disease. After reviewing the risks and                            benefits, the patient was deemed in satisfactory                            condition to undergo the procedure.                           After obtaining informed consent, the endoscope was  passed under direct vision. Throughout the                            procedure, the patient's blood pressure, pulse, and                            oxygen saturations were monitored continuously. The                            GIF [id number] was introduced through the                            mouth, and advanced to the second part of duodenum.                             The upper GI endoscopy was accomplished without                            difficulty. The patient tolerated the procedure                            well. Scope In: Scope Out: Findings:                 The examined esophagus was normal. Biopsies were                            taken with a cold forceps for histology.                           A small hiatal hernia was present.                           Two 6 to 10 mm sessile polyps with no bleeding and                            no stigmata of recent bleeding were found in the                            gastric body. These polyps were removed with a cold                            snare. Resection and retrieval were complete.                           A few localized erosions with no bleeding and no                            stigmata of recent bleeding were found in the                            gastric antrum. Biopsies were taken with a cold  forceps for histology.                           The examined duodenum was normal. Complications:            No immediate complications. Estimated Blood Loss:     Estimated blood loss was minimal. Impression:               - Normal esophagus. Biopsied.                           - Small hiatal hernia.                           - Two gastric polyps. Resected and retrieved.                           - Erosive gastropathy with no bleeding and no                            stigmata of recent bleeding. Biopsied.                           - Normal examined duodenum. Recommendation:           - Discharge patient to home (with escort).                           - Await pathology results.                           - Can consider starting on pantoprazole 40 mg BID                            in the future if Pepcid and sucralfate is                            inadequate for pain control.                           - The findings and recommendations were discussed                             with the patient. Dr Georgian Co "Lyndee Leo" Lorenso Courier,  03/03/2023 11:41:18 AM

## 2023-03-04 ENCOUNTER — Telehealth: Payer: Self-pay

## 2023-03-04 ENCOUNTER — Encounter: Payer: Self-pay | Admitting: Family Medicine

## 2023-03-04 NOTE — Telephone Encounter (Signed)
Post procedure follow up call, no answer 

## 2023-03-05 ENCOUNTER — Other Ambulatory Visit: Payer: Self-pay | Admitting: Family Medicine

## 2023-03-05 MED ORDER — LEVOTHYROXINE SODIUM 88 MCG PO TABS: 88.0000 ug | ORAL_TABLET | Freq: Every day | ORAL | 3 refills | Status: DC

## 2023-03-09 ENCOUNTER — Encounter: Payer: Self-pay | Admitting: Internal Medicine

## 2023-03-29 ENCOUNTER — Encounter: Payer: Self-pay | Admitting: Internal Medicine

## 2023-03-29 ENCOUNTER — Ambulatory Visit (AMBULATORY_SURGERY_CENTER): Payer: 59 | Admitting: Internal Medicine

## 2023-03-29 VITALS — BP 160/95 | HR 85 | Temp 98.7°F | Resp 17 | Ht 65.0 in | Wt 227.0 lb

## 2023-03-29 DIAGNOSIS — Z1211 Encounter for screening for malignant neoplasm of colon: Secondary | ICD-10-CM | POA: Diagnosis present

## 2023-03-29 MED ORDER — SODIUM CHLORIDE 0.9 % IV SOLN: 500.0000 mL | Freq: Once | INTRAVENOUS | Status: DC

## 2023-03-29 NOTE — Progress Notes (Signed)
GASTROENTEROLOGY PROCEDURE H&P NOTE   Primary Care Physician: Tommie Sams, DO    Reason for Procedure:   Colon cancer screening  Plan:    Colonoscopy  Patient is appropriate for endoscopic procedure(s) in the ambulatory ([facility]) setting.  The nature of the procedure, as well as the risks, benefits, and alternatives were carefully and thoroughly reviewed with the patient. Ample time for discussion and questions allowed. The patient understood, was satisfied, and agreed to proceed.     HPI: [name] is a 57 y.o. female who presents for colonoscopy for evaluation of colon cancer screening .  Patient was most recently seen in the Gastroenterology Clinic on 02/01/23.  No interval change in medical history since that appointment. Please refer to that note for full details regarding GI history and clinical presentation.   Past Medical History:  Diagnosis Date   GERD (gastroesophageal reflux disease)    Hypothyroidism    IBS (irritable bowel syndrome)    S/P colonoscopy 07/13/2010   [place]: no results available. Reportedly done because heme +    Past Surgical History:  Procedure Laterality Date   CESAREAN SECTION     X 2   CHOLECYSTECTOMY     KNEE SURGERY      Prior to Admission medications   Medication Sig Start Date End Date Taking? Authorizing Provider  ALPRAZolam Prudy Feeler) 0.5 MG tablet Take 0.5 mg by mouth at bedtime as needed for anxiety.   Yes [provider]  levothyroxine (SYNTHROID) 88 MCG tablet Take 1 tablet (88 mcg total) by mouth daily. 03/05/23  Yes Cook, Jayce G, DO  pantoprazole (PROTONIX) 40 MG tablet Take 40 mg by mouth daily.   Yes [provider]  famotidine (PEPCID) 20 MG tablet Take 1 tablet (20 mg total) by mouth 2 (two) times daily. 02/16/23   Gerhard Munch, MD  lidocaine (XYLOCAINE) 2 % solution Take one tsp po TID prn throat/esophageal pain Patient not taking: Reported on 03/02/2023 02/11/23   Campbell Riches, NP  sucralfate  (CARAFATE) 1 g tablet Take 1 tablet (1 g total) by mouth 4 (four) times daily -  with meals and at bedtime. 02/16/23   Gerhard Munch, MD    Current Outpatient Medications  Medication Sig Dispense Refill   ALPRAZolam (XANAX) 0.5 MG tablet Take 0.5 mg by mouth at bedtime as needed for anxiety.     levothyroxine (SYNTHROID) 88 MCG tablet Take 1 tablet (88 mcg total) by mouth daily. 90 tablet 3   pantoprazole (PROTONIX) 40 MG tablet Take 40 mg by mouth daily.     famotidine (PEPCID) 20 MG tablet Take 1 tablet (20 mg total) by mouth 2 (two) times daily. 30 tablet 0   lidocaine (XYLOCAINE) 2 % solution Take one tsp po TID prn throat/esophageal pain (Patient not taking: Reported on 03/02/2023) 100 mL 0   sucralfate (CARAFATE) 1 g tablet Take 1 tablet (1 g total) by mouth 4 (four) times daily -  with meals and at bedtime. 21 tablet 0   Current Facility-Administered Medications  Medication Dose Route Frequency Provider Last Rate Last Admin   0.9 %  sodium chloride infusion  500 mL Intravenous Once Imogene Burn, MD        Allergies as of 03/29/2023 - Review Complete 03/29/2023  Allergen Reaction Noted   Erythromycin Hives 04/08/2011   Amoxil [amoxicillin] Rash 02/28/2013    Family History  Problem Relation Age of Onset   Diabetes Father    Colon cancer  Neg Hx    Esophageal cancer Neg Hx    Rectal cancer Neg Hx    Stomach cancer Neg Hx     Social History   Socioeconomic History   Marital status: Married    Spouse name: Not on file   Number of children: 2   Years of education: Not on file   Highest education level: Not on file  Occupational History   Occupation: Community education officer: [employer]    Comment: [name],   Tobacco Use   Smoking status: Never   Smokeless tobacco: Never  Vaping Use   Vaping Use: Never used  Substance and Sexual Activity   Alcohol use: No    Alcohol/week: 0.0 standard drinks of alcohol   Drug use: No   Sexual activity: Yes    Partners: Male     Birth control/protection: None    Comment: spouse  Other Topics Concern   Not on file  Social History Narrative   Not on file   Social Determinants of Health   Financial Resource Strain: Not on file  Food Insecurity: Not on file  Transportation Needs: Not on file  Physical Activity: Not on file  Stress: Not on file  Social Connections: Not on file  Intimate Partner Violence: Not on file    Physical Exam: Vital signs in last 24 hours: BP (!) 174/90   Pulse (!) 118   Temp 98.7 F (37.1 C)   Ht  (1.651 m)   Wt 227 lb (103 kg)   LMP 01/11/2016   SpO2 100%   BMI 37.77 kg/m  GEN: NAD EYE: Sclerae anicteric ENT: MMM CV: Non-tachycardic Pulm: No increased WOB GI: Soft NEURO:  Alert & Oriented   Eulah Pont, MD  Gastroenterology   03/29/2023 3:16 PM

## 2023-03-29 NOTE — Patient Instructions (Signed)
Repeat colonoscopy in 10 years for screening purposes.  Return to GI clinic as scheduled in 2 months.   YOU HAD AN ENDOSCOPIC PROCEDURE TODAY AT THE [facility]:   Refer to the procedure report that was given to you for any specific questions about what was found during the examination.  If the procedure report does not answer your questions, please call your gastroenterologist to clarify.  If you requested that your care partner not be given the details of your procedure findings, then the procedure report has been included in a sealed envelope for you to review at your convenience later.  YOU SHOULD EXPECT: Some feelings of bloating in the abdomen. Passage of more gas than usual.  Walking can help get rid of the air that was put into your GI tract during the procedure and reduce the bloating. If you had a lower endoscopy (such as a colonoscopy or flexible sigmoidoscopy) you may notice spotting of blood in your stool or on the toilet paper. If you underwent a bowel prep for your procedure, you may not have a normal bowel movement for a few days.  Please Note:  You might notice some irritation and congestion in your nose or some drainage.  This is from the oxygen used during your procedure.  There is no need for concern and it should clear up in a day or so.  SYMPTOMS TO REPORT IMMEDIATELY:  Following lower endoscopy (colonoscopy or flexible sigmoidoscopy):  Excessive amounts of blood in the stool  Significant tenderness or worsening of abdominal pains  Swelling of the abdomen that is new, acute  Fever of 100F or higher  For urgent or emergent issues, a gastroenterologist can be reached at any hour by calling [phone]. Do not use MyChart messaging for urgent concerns.    DIET:  We do recommend a small meal at first, but then you may proceed to your regular diet.  Drink plenty of fluids but you should avoid alcoholic beverages for 24 hours.  ACTIVITY:  You should plan to  take it easy for the rest of today and you should NOT DRIVE or use heavy machinery until tomorrow (because of the sedation medicines used during the test).    FOLLOW UP: Our staff will call the number listed on your records the next business day following your procedure.  We will call around 7:15- 8:00 am to check on you and address any questions or concerns that you may have regarding the information given to you following your procedure. If we do not reach you, we will leave a message.     If any biopsies were taken you will be contacted by phone or by letter within the next 1-3 weeks.  Please call us at [phone] if you have not heard about the biopsies in 3 weeks.    SIGNATURES/CONFIDENTIALITY: You and/or your care partner have signed paperwork which will be entered into your electronic medical record.  These signatures attest to the fact that that the information above on your After Visit Summary has been reviewed and is understood.  Full responsibility of the confidentiality of this discharge information lies with you and/or your care-partner.

## 2023-03-29 NOTE — Progress Notes (Signed)
Sedate, gd SR, tolerated procedure well, VSS, report to RN 

## 2023-03-29 NOTE — Op Note (Signed)
[name] Procedure Date: 03/29/2023 3:22 PM MRN: [id number] Endoscopist: Madelyn Brunner [place] , , [id number] Age: 57 Referring MD:  Date of Birth: 02-16-1966 Gender: Female Account #: [id number] Procedure:                Colonoscopy Indications:              Screening for colorectal malignant neoplasm Medicines:                Monitored Anesthesia Care Procedure:                Pre-Anesthesia Assessment:                           - Prior to the procedure, a History and Physical                            was performed, and patient medications and                            allergies were reviewed. The patient's tolerance of                            previous anesthesia was also reviewed. The risks                            and benefits of the procedure and the sedation                            options and risks were discussed with the patient.                            All questions were answered, and informed consent                            was obtained. Prior Anticoagulants: The patient has                            taken no anticoagulant or antiplatelet agents. ASA                            Grade Assessment: II - A patient with mild systemic                            disease. After reviewing the risks and benefits,                            the patient was deemed in satisfactory condition to                            undergo the procedure.                           After obtaining informed consent, the colonoscope  was passed under direct vision. Throughout the                            procedure, the patient's blood pressure, pulse, and                            oxygen saturations were monitored continuously. The                            CF [id number] #[id number] was introduced through the anus                            and advanced to the the terminal ileum. The                            colonoscopy was  performed without difficulty. The                            patient tolerated the procedure well. The quality                            of the bowel preparation was good. The terminal                            ileum, ileocecal valve, appendiceal orifice, and                            rectum were photographed. Scope In: 3:26:33 PM Scope Out: 3:47:52 PM Scope Withdrawal Time: 0 hours 15 minutes 19 seconds  Total Procedure Duration: 0 hours 21 minutes 19 seconds  Findings:                 The terminal ileum appeared normal.                           Non-bleeding internal hemorrhoids were found during                            retroflexion.                           The exam was otherwise without abnormality. Complications:            No immediate complications. Estimated Blood Loss:     Estimated blood loss was minimal. Impression:               - The examined portion of the ileum was normal.                           - Non-bleeding internal hemorrhoids.                           - The examination was otherwise normal.                           - No specimens collected. Recommendation:           -  Discharge patient to home (with escort).                           - Repeat colonoscopy in 10 years for screening                            purposes.                           - Return to GI clinic in 2 months.                           - The findings and recommendations were discussed                            with the patient. Dr Particia Lather "Alan Ripper" Leonides Schanz,  03/29/2023 3:54:10 PM

## 2023-03-29 NOTE — Progress Notes (Signed)
VS by DT  Pt's states no medical or surgical changes since previsit or office visit.  

## 2023-03-30 ENCOUNTER — Telehealth: Payer: Self-pay | Admitting: *Deleted

## 2023-03-30 NOTE — Telephone Encounter (Signed)
Post procedure follow up phone call. No answer at number given.  Left message on voicemail.  

## 2023-04-24 ENCOUNTER — Encounter (INDEPENDENT_AMBULATORY_CARE_PROVIDER_SITE_OTHER): Payer: 59

## 2023-04-24 ENCOUNTER — Telehealth: Payer: Self-pay | Admitting: Pulmonary Disease

## 2023-04-24 DIAGNOSIS — R0683 Snoring: Secondary | ICD-10-CM

## 2023-04-24 DIAGNOSIS — G4733 Obstructive sleep apnea (adult) (pediatric): Secondary | ICD-10-CM

## 2023-04-24 NOTE — Telephone Encounter (Signed)
Very mild OSA on sleep test NO treatment at this time other than weight loss Reassess in 1 year or if symptoms worsen

## 2023-04-25 NOTE — Telephone Encounter (Signed)
ATC pt LVM w/ RA's message. (Ok per [employer]). Instructed pt to call back w/ any questions.

## 2023-04-27 LAB — T4, FREE: Free T4: 1.2 ng/dL (ref 0.82–1.77)

## 2023-04-27 LAB — TSH: TSH: 2.23 u[IU]/mL (ref 0.450–4.500)

## 2023-04-27 LAB — HEMOGLOBIN A1C
Est. average glucose Bld gHb Est-mCnc: 117 mg/dL
Hgb A1c MFr Bld: 5.7 % — ABNORMAL HIGH (ref 4.8–5.6)

## 2023-04-27 LAB — LIPID PANEL
Chol/HDL Ratio: 4.7 ratio — ABNORMAL HIGH (ref 0.0–4.4)
Cholesterol, Total: 265 mg/dL — ABNORMAL HIGH (ref 100–199)
HDL: 56 mg/dL (ref >39)
LDL Chol Calc (NIH): 167 mg/dL — ABNORMAL HIGH (ref 0–99)
Triglycerides: 228 mg/dL — ABNORMAL HIGH (ref 0–149)
VLDL Cholesterol Cal: 42 mg/dL — ABNORMAL HIGH (ref 5–40)

## 2023-05-02 ENCOUNTER — Encounter: Payer: Self-pay | Admitting: Family Medicine

## 2023-05-13 ENCOUNTER — Ambulatory Visit: Payer: BC Managed Care – PPO | Admitting: Podiatry

## 2023-05-13 ENCOUNTER — Encounter: Payer: Self-pay | Admitting: Podiatry

## 2023-05-13 ENCOUNTER — Ambulatory Visit (INDEPENDENT_AMBULATORY_CARE_PROVIDER_SITE_OTHER): Payer: 59

## 2023-05-13 DIAGNOSIS — M7751 Other enthesopathy of right foot: Secondary | ICD-10-CM | POA: Diagnosis not present

## 2023-05-13 DIAGNOSIS — M778 Other enthesopathies, not elsewhere classified: Secondary | ICD-10-CM

## 2023-05-13 MED ORDER — TRIAMCINOLONE ACETONIDE 10 MG/ML IJ SUSP
10.0000 mg | Freq: Once | INTRAMUSCULAR | Status: AC
Start: 2023-05-13 — End: 2023-05-13
  Administered 2023-05-13: 10 mg

## 2023-05-13 NOTE — Progress Notes (Signed)
Subjective:   Patient ID: [name], female   DOB: 57 y.o.   MRN: [id number]   HPI Patient states she is developed a lot of pain on the bottom of her right foot and states that it has been going on for around a year but is worse over the last few months.  Feels like her second and third toes are separating and that she is walking on a ball.  Has tried new sneakers and rigid bottom shoes as best as possible.  Patient does not smoke likes to be active   Review of Systems  All other systems reviewed and are negative.       Objective:  Physical Exam Vitals and nursing note reviewed.  Constitutional:      Appearance: She is well-developed.  Pulmonary:     Effort: Pulmonary effort is normal.  Musculoskeletal:        General: Normal range of motion.  Skin:    General: Skin is warm.  Neurological:     Mental Status: She is alert.     Neurovascular status intact muscle strength adequate range of motion adequate with patient found to have exquisite discomfort second metatarsal phalangeal joint right with inflammation fluid noted within the joint surface and moderate medial dislocation of the second digit.  Patient is found to have good digital perfusion well-oriented x 3     Assessment:  Inflammatory capsulitis second MPJ right with possibility for flexor plate pathology     Plan:  H&P reviewed and were going to treat this conservatively even though eventually shortening osteotomy might be necessary.  I did a forefoot block 60 mg like Marcaine mixture sterile prep done aspirated the second MPJ getting out a small amount of a slightly pinkish fluid indicating possible flexor plate damage and injected quarter cc dexamethasone Kenalog and applied thick plantar padding to disperse pressure.  Reappoint 3 weeks to recheck  X-rays indicate there is a elongated second metatarsal segment right no indications of her arthritis stress fracture

## 2023-05-27 ENCOUNTER — Encounter: Payer: Self-pay | Admitting: Nurse Practitioner

## 2023-05-27 ENCOUNTER — Other Ambulatory Visit: Payer: Self-pay

## 2023-05-27 ENCOUNTER — Ambulatory Visit: Payer: 59 | Admitting: Physician Assistant

## 2023-05-27 MED ORDER — LEVOTHYROXINE SODIUM 88 MCG PO TABS: 88.0000 ug | ORAL_TABLET | Freq: Every day | ORAL | 2 refills | Status: DC

## 2023-06-02 ENCOUNTER — Encounter: Payer: Self-pay | Admitting: Gastroenterology

## 2023-06-02 ENCOUNTER — Ambulatory Visit (INDEPENDENT_AMBULATORY_CARE_PROVIDER_SITE_OTHER): Payer: 59 | Admitting: Gastroenterology

## 2023-06-02 VITALS — BP 138/82 | HR 84 | Ht 65.0 in | Wt 224.1 lb

## 2023-06-02 DIAGNOSIS — K219 Gastro-esophageal reflux disease without esophagitis: Secondary | ICD-10-CM

## 2023-06-02 MED ORDER — PANTOPRAZOLE SODIUM 40 MG PO TBEC: 40.0000 mg | DELAYED_RELEASE_TABLET | Freq: Every day | ORAL | 3 refills | Status: DC

## 2023-06-02 NOTE — Patient Instructions (Signed)
_______________________________________________________  If your blood pressure at your visit was 140/90 or greater, please contact your primary care physician to follow up on this.  _______________________________________________________  If you are age 56 or older, your body mass index should be between 23-30. Your Body mass index is 37.3 kg/m. If this is out of the aforementioned range listed, please consider follow up with your Primary Care Provider.  If you are age 6 or younger, your body mass index should be between 19-25. Your Body mass index is 37.3 kg/m. If this is out of the aformentioned range listed, please consider follow up with your Primary Care Provider.   ________________________________________________________  The  GI providers would like to encourage you to use [facility] to communicate with providers for non-urgent requests or questions.  Due to long hold times on the telephone, sending your provider a message by [facility] may be a faster and more efficient way to get a response.  Please allow 48 business hours for a response.  Please remember that this is for non-urgent requests.  _______________________________________________________  We have sent the following medications to your pharmacy for you to pick up at your convenience: Pantoprazole 40 mg.

## 2023-06-02 NOTE — Progress Notes (Signed)
06/02/2023 [name] [id number] 1966-05-11   HISTORY OF PRESENT ILLNESS: This is a 57 year old female is a patient Dr. Derek Mound.  She is here for follow-up after her EGD back in March.  In regards to her reflux symptoms she is currently using pantoprazole 40 mg daily.  Uses famotidine and Carafate as needed.  Says that overall she is feeling well as long as she avoids certain foods and follows the diet that she needs to.  She says that recently her biggest issue was tomatoes and tomato based products.  EGD 02/2023:  - Normal esophagus. Biopsied. - Small hiatal hernia. - Two gastric polyps. Resected and retrieved. - Erosive gastropathy with no bleeding and no stigmata of recent bleeding. Biopsied. - Normal examined duodenum.  1. Surgical [P], gastric - GASTRIC ANTRAL AND OXYNTIC MUCOSA WITH NO SPECIFIC HISTOPATHOLOGIC CHANGES - HELICOBACTER PYLORI-LIKE ORGANISMS ARE NOT IDENTIFIED ON ROUTINE H&E STAIN 2. Surgical [P], gastric polyp, polyp (1) - ESOPHAGEAL SQUAMOUS MUCOSA WITH MILD VASCULAR CONGESTION, AND FOCAL SQUAMOUS BALLOONING, SUGGESTIVE OF REFLUX ESOPHAGITIS, SEE NOTE - NEGATIVE FOR INCREASED INTRAEPITHELIAL EOSINOPHILS 3. Surgical [P], colon, distal - FUNDIC GLAND POLYP(S), SEE NOTE - NEGATIVE FOR DYSPLASIA   Past Medical History:  Diagnosis Date   GERD (gastroesophageal reflux disease)    Hypothyroidism    IBS (irritable bowel syndrome)    S/P colonoscopy 07/13/2010   [place]: no results available. Reportedly done because heme +   Past Surgical History:  Procedure Laterality Date   CESAREAN SECTION     X 2   CHOLECYSTECTOMY     KNEE SURGERY      reports that she has never smoked. She has never used smokeless tobacco. She reports that she does not drink alcohol and does not use drugs. family history includes Diabetes in her father. Allergies  Allergen Reactions   Erythromycin Hives   Amoxil [Amoxicillin] Rash    Rash on palms of hands; peeling of hands        Outpatient Encounter Medications as of 06/02/2023  Medication Sig   ALPRAZolam (XANAX) 0.5 MG tablet Take 0.5 mg by mouth at bedtime as needed for anxiety.   famotidine (PEPCID) 20 MG tablet Take 1 tablet (20 mg total) by mouth 2 (two) times daily. (Patient taking differently: Take 20 mg by mouth as needed.)   levothyroxine (SYNTHROID) 88 MCG tablet Take 1 tablet (88 mcg total) by mouth daily before breakfast.   pantoprazole (PROTONIX) 40 MG tablet Take 40 mg by mouth daily.   sucralfate (CARAFATE) 1 g tablet Take 1 tablet (1 g total) by mouth 4 (four) times daily -  with meals and at bedtime. (Patient taking differently: Take 1 g by mouth as needed.)   [DISCONTINUED] levothyroxine (SYNTHROID) 88 MCG tablet Take 1 tablet (88 mcg total) by mouth daily.   No facility-administered encounter medications on file as of 06/02/2023.     REVIEW OF SYSTEMS  : All other systems reviewed and negative except where noted in the History of Present Illness.   PHYSICAL EXAM: BP 138/82   Pulse 84   Ht 5\' 5"  (1.651 m)   Wt 224 lb 2 oz (101.7 kg)   LMP 01/11/2016   BMI 37.30 kg/m  General: Well developed white female in no acute distress Head: Normocephalic and atraumatic Eyes:  Sclerae anicteric, conjunctiva pink. Ears: Normal auditory acuity Musculoskeletal: Symmetrical with no gross deformities  Neurological: Alert oriented x 4, grossly non-focal Psychological:  Alert and cooperative. Normal mood and affect  ASSESSMENT AND PLAN: *GERD: Currently well-controlled on pantoprazole 40 mg daily.  Uses Pepcid and Carafate only as needed.  Will continue this.  New prescription sent to pharmacy.  Will follow-up in 12 to 18 months for further refills or sooner if needed for any further issues.  GERD literature reviewed and given.  CC:  Tommie Sams, DO

## 2023-06-02 NOTE — Progress Notes (Signed)
I agree with the assessment and plan as outlined by Ms. Zehr. 

## 2023-06-06 ENCOUNTER — Encounter: Payer: Self-pay | Admitting: Podiatry

## 2023-06-06 ENCOUNTER — Ambulatory Visit: Payer: BC Managed Care – PPO | Admitting: Podiatry

## 2023-06-06 DIAGNOSIS — M7751 Other enthesopathy of right foot: Secondary | ICD-10-CM | POA: Diagnosis not present

## 2023-06-06 DIAGNOSIS — R87619 Unspecified abnormal cytological findings in specimens from cervix uteri: Secondary | ICD-10-CM | POA: Insufficient documentation

## 2023-06-07 NOTE — Progress Notes (Signed)
Subjective:   Patient ID: [name], female   DOB: 57 y.o.   MRN: [id number]   HPI Patient states doing much better currently with diminishment of discomfort of about 80%.  States the pad has been beneficial   ROS      Objective:  Physical Exam  Neuro vas scaler status intact reduced inflammation around the lesser MPJ right with mild fluid still noted     Assessment:  Inflammatory capsulitis improved but present     Plan:  H&P reviewed condition recommended the continuation of conservative care and rigid bottom shoes and padding.  Patient will be seen back as symptoms indicate

## 2023-06-15 ENCOUNTER — Ambulatory Visit: Payer: 59 | Admitting: Physician Assistant

## 2023-10-04 ENCOUNTER — Telehealth: Payer: Self-pay | Admitting: *Deleted

## 2023-10-04 NOTE — Telephone Encounter (Signed)
Patient states she has new insurance and needs PA for Pantoprazle. New insurance card scanned into chart.   RX BIN: [id number] Subscriber ID: [id number] Group #: [id number]

## 2023-10-05 NOTE — Telephone Encounter (Signed)
Please let me know once medication is approved.

## 2023-10-11 ENCOUNTER — Telehealth: Payer: Self-pay | Admitting: Pharmacy Technician

## 2023-10-11 ENCOUNTER — Other Ambulatory Visit (HOSPITAL_COMMUNITY): Payer: Self-pay

## 2023-10-11 NOTE — Telephone Encounter (Signed)
Pharmacy Patient Advocate Encounter   Received notification from CoverMyMeds that prior authorization for PANTOPRAZOLE 40MG  is required/requested.   Insurance verification completed.   The patient is insured through [facility] .   Per test claim: PA required; PA submitted to above mentioned insurance via CoverMyMeds Key/confirmation #/EOC [id number] Status is pending  Pt has tried omeprazole and esomeprazole

## 2023-10-12 ENCOUNTER — Other Ambulatory Visit (HOSPITAL_COMMUNITY): Payer: Self-pay

## 2023-10-12 NOTE — Telephone Encounter (Signed)
Pharmacy Patient Advocate Encounter  Received notification from [facility] that Prior Authorization for PANTOPRAZOLE 40MG  has been APPROVED from 10.29.24 to 10.29.25. Ran test claim, Copay is $0. This test claim was processed through [facility]- copay amounts may vary at other pharmacies due to pharmacy/plan contracts, or as the patient moves through the different stages of their insurance plan.   PA #/Case ID/Reference #: [id number]

## 2023-10-12 NOTE — Telephone Encounter (Signed)
PA has been submitted, and telephone encounter has been created. PA has been APPROVED.

## 2023-10-13 NOTE — Telephone Encounter (Signed)
Patient informed of approval.  

## 2023-10-19 ENCOUNTER — Encounter: Payer: Self-pay | Admitting: Family Medicine

## 2023-10-20 DIAGNOSIS — Z124 Encounter for screening for malignant neoplasm of cervix: Secondary | ICD-10-CM | POA: Insufficient documentation

## 2023-10-20 LAB — HM PAP SMEAR

## 2023-10-30 ENCOUNTER — Encounter: Payer: Self-pay | Admitting: Emergency Medicine

## 2023-10-30 ENCOUNTER — Telehealth: Payer: Self-pay | Admitting: Emergency Medicine

## 2023-10-30 ENCOUNTER — Other Ambulatory Visit: Payer: Self-pay

## 2023-10-30 ENCOUNTER — Ambulatory Visit
Admission: EM | Admit: 2023-10-30 | Discharge: 2023-10-30 | Disposition: A | Discharge status: Home / Self Care | Payer: 59 | Attending: Family Medicine | Admitting: Family Medicine

## 2023-10-30 ENCOUNTER — Ambulatory Visit: Payer: 59

## 2023-10-30 DIAGNOSIS — J208 Acute bronchitis due to other specified organisms: Secondary | ICD-10-CM | POA: Diagnosis not present

## 2023-10-30 MED ORDER — PROMETHAZINE-DM 6.25-15 MG/5ML PO SYRP: 5.0000 mL | ORAL_SOLUTION | Freq: Four times a day (QID) | ORAL | 0 refills | Status: DC | PRN

## 2023-10-30 MED ORDER — PREDNISONE 20 MG PO TABS: 40.0000 mg | ORAL_TABLET | Freq: Every day | ORAL | 0 refills | Status: DC

## 2023-10-30 NOTE — Telephone Encounter (Signed)
Provider reported chest xray was negative for pneumonia and to notify pt would like to treat for viral bronchitis with prednisone, cough syrup, mucinex, and other otc remedies pt taking already. Pt agreeable to plan and reported would like px to be sent to [facility] on [address] in [place]. Provider aware.

## 2023-10-30 NOTE — ED Provider Notes (Signed)
[facility]    CSN: [id number] Arrival date & time: 10/30/23  1011      History   Chief Complaint Chief Complaint  Patient presents with   Cough    HPI [name] is a 57 y.o. female.   Patient presenting today with 3-day history of productive cough, congestion, sinus drainage.  Denies fever, chills, chest pain, shortness of breath, abdominal pain, nausea vomiting or diarrhea.  So far tried some Tylenol cold and sinus with minimal relief.  States multiple family members have had similar symptoms for a week or so now and that they were diagnosed with walking pneumonia.    Past Medical History:  Diagnosis Date   GERD (gastroesophageal reflux disease)    Hypothyroidism    IBS (irritable bowel syndrome)    S/P colonoscopy 07/13/2010   [place]: no results available. Reportedly done because heme +    Patient Active Problem List   Diagnosis Date Noted   Abnormal cervical Papanicolaou smear 06/06/2023   Hepatic steatosis 02/14/2023   Aortic atherosclerosis (HCC) 02/14/2023   Sleep apnea 02/14/2023   Irritable bowel syndrome with diarrhea 02/11/2023   Acute gastritis without hemorrhage 11/15/2022   Anemia 07/14/2020   Disorder of gallbladder 07/14/2020   Migraine 07/14/2020   Urinary tract infectious disease 07/14/2020   Prediabetes 06/24/2017   Hypothyroidism 04/01/2016   GERD (gastroesophageal reflux disease) 03/10/2013    Past Surgical History:  Procedure Laterality Date   CESAREAN SECTION     X 2   CHOLECYSTECTOMY     KNEE SURGERY      OB History   No obstetric history on file.      Home Medications    Prior to Admission medications   Medication Sig Start Date End Date Taking? Authorizing Provider  predniSONE (DELTASONE) 20 MG tablet Take 2 tablets (40 mg total) by mouth daily with breakfast. 10/30/23  Yes Particia Nearing, PA-C  promethazine-dextromethorphan (PROMETHAZINE-DM) 6.25-15 MG/5ML syrup Take 5 mLs by mouth 4 (four)  times daily as needed. 10/30/23  Yes Particia Nearing, PA-C  ALPRAZolam Prudy Feeler) 0.5 MG tablet Take 0.5 mg by mouth at bedtime as needed for anxiety.    [provider]  famotidine (PEPCID) 20 MG tablet Take 1 tablet (20 mg total) by mouth 2 (two) times daily. Patient taking differently: Take 20 mg by mouth as needed. 02/16/23   Gerhard Munch, MD  levothyroxine (SYNTHROID) 88 MCG tablet Take 1 tablet (88 mcg total) by mouth daily before breakfast. 05/27/23   Tommie Sams, DO  pantoprazole (PROTONIX) 40 MG tablet Take 1 tablet (40 mg total) by mouth daily. 06/02/23   Zehr, Princella Pellegrini, PA-C  sucralfate (CARAFATE) 1 g tablet Take 1 tablet (1 g total) by mouth 4 (four) times daily -  with meals and at bedtime. Patient taking differently: Take 1 g by mouth as needed. 02/16/23   Gerhard Munch, MD    Family History Family History  Problem Relation Age of Onset   Diabetes Father    Colon cancer Neg Hx    Esophageal cancer Neg Hx    Rectal cancer Neg Hx    Stomach cancer Neg Hx     Social History Social History   Tobacco Use   Smoking status: Never   Smokeless tobacco: Never  Vaping Use   Vaping status: Never Used  Substance Use Topics   Alcohol use: No    Alcohol/week: 0.0 standard drinks of alcohol   Drug use: No  Allergies   Erythromycin and Amoxil [amoxicillin]   Review of Systems Review of Systems Per HPI  Physical Exam Triage Vital Signs ED Triage Vitals  Encounter Vitals Group     BP 10/30/23 1021 (!) 158/101     Systolic BP Percentile --      Diastolic BP Percentile --      Pulse Rate 10/30/23 1021 92     Resp 10/30/23 1021 20     Temp 10/30/23 1021 99.8 F (37.7 C)     Temp Source 10/30/23 1021 Oral     SpO2 10/30/23 1021 98 %     Weight --      Height --      Head Circumference --      Peak Flow --      Pain Score 10/30/23 1020 3     Pain Loc --      Pain Education --      Exclude from Growth Chart --    No data found.  Updated  Vital Signs BP (!) 158/101 (BP Location: Right Arm) Comment: repeat x2.NAD noted.  Pulse 92   Temp 99.8 F (37.7 C) (Oral)   Resp 20   LMP 01/11/2016   SpO2 98%   Visual Acuity Right Eye Distance:   Left Eye Distance:   Bilateral Distance:    Right Eye Near:   Left Eye Near:    Bilateral Near:     Physical Exam Vitals and nursing note reviewed.  Constitutional:      Appearance: Normal appearance.  HENT:     Head: Atraumatic.     Right Ear: Tympanic membrane and external ear normal.     Left Ear: Tympanic membrane and external ear normal.     Nose: Rhinorrhea present.     Mouth/Throat:     Mouth: Mucous membranes are moist.     Pharynx: Posterior oropharyngeal erythema present.  Eyes:     Extraocular Movements: Extraocular movements intact.     Conjunctiva/sclera: Conjunctivae normal.  Cardiovascular:     Rate and Rhythm: Normal rate and regular rhythm.     Heart sounds: Normal heart sounds.  Pulmonary:     Effort: Pulmonary effort is normal.     Breath sounds: Normal breath sounds. No wheezing or rales.  Musculoskeletal:        General: Normal range of motion.     Cervical back: Normal range of motion and neck supple.  Skin:    General: Skin is warm and dry.  Neurological:     Mental Status: She is alert and oriented to person, place, and time.  Psychiatric:        Mood and Affect: Mood normal.        Thought Content: Thought content normal.      UC Treatments / Results  Labs (all labs ordered are listed, but only abnormal results are displayed) Labs Reviewed - No data to display  EKG   Radiology DG Chest 2 View  Result Date: 10/30/2023 CLINICAL DATA:  Cough and chest congestion for 3 days. EXAM: CHEST - 2 VIEW COMPARISON:  02/16/2023 FINDINGS: The heart size and mediastinal contours are within normal limits. Both lungs are clear. The visualized skeletal structures are unremarkable. IMPRESSION: No active cardiopulmonary disease. Electronically Signed    By: Danae Orleans M.D.   On: 10/30/2023 11:15    Procedures Procedures (including critical care time)  Medications Ordered in UC Medications - No data to display  Initial Impression / Assessment  and Plan / UC Course  I have reviewed the triage vital signs and the nursing notes.  Pertinent labs & imaging results that were available during my care of the patient were reviewed by me and considered in my medical decision making (see chart for details).     Vital signs reassuring today, exam suggestive of viral etiology.  Chest x-ray negative for pneumonia, will treat for a viral bronchitis with prednisone, Phenergan DM and continue cold and congestion medications and supportive home care.  Return for worsening symptoms.  Final Clinical Impressions(s) / UC Diagnoses   Final diagnoses:  Viral bronchitis     Discharge Instructions      We will call once her chest x-ray results return and discussed a treatment plan and send medications over    ED Prescriptions     Medication Sig Dispense Auth. Provider   predniSONE (DELTASONE) 20 MG tablet Take 2 tablets (40 mg total) by mouth daily with breakfast. 10 tablet Particia Nearing, PA-C   promethazine-dextromethorphan (PROMETHAZINE-DM) 6.25-15 MG/5ML syrup Take 5 mLs by mouth 4 (four) times daily as needed. 100 mL Particia Nearing, [place]      PDMP not reviewed this encounter.   Particia Nearing, [place] 10/30/23 1132

## 2023-10-30 NOTE — Discharge Instructions (Signed)
We will call once her chest x-ray results return and discussed a treatment plan and send medications over

## 2023-10-30 NOTE — ED Triage Notes (Signed)
Pt reports cough since Thursday with intermittent phelgm and reports has started to affect sleep. Reports family members have had similar.

## 2023-11-03 ENCOUNTER — Encounter: Payer: Self-pay | Admitting: Family Medicine

## 2023-11-04 ENCOUNTER — Encounter: Payer: Self-pay | Admitting: Nurse Practitioner

## 2023-11-28 ENCOUNTER — Emergency Department (HOSPITAL_COMMUNITY): Payer: 59

## 2023-11-28 ENCOUNTER — Inpatient Hospital Stay (HOSPITAL_COMMUNITY)
Admission: EM | Admit: 2023-11-28 | Discharge: 2023-11-29 | DRG: 066 | Disposition: A | Discharge status: Home / Self Care | Payer: 59 | Attending: Family Medicine | Admitting: Family Medicine

## 2023-11-28 ENCOUNTER — Other Ambulatory Visit: Payer: Self-pay

## 2023-11-28 ENCOUNTER — Encounter (HOSPITAL_COMMUNITY): Payer: Self-pay

## 2023-11-28 DIAGNOSIS — Z881 Allergy status to other antibiotic agents status: Secondary | ICD-10-CM | POA: Diagnosis not present

## 2023-11-28 DIAGNOSIS — Z833 Family history of diabetes mellitus: Secondary | ICD-10-CM | POA: Diagnosis not present

## 2023-11-28 DIAGNOSIS — H539 Unspecified visual disturbance: Secondary | ICD-10-CM | POA: Diagnosis present

## 2023-11-28 DIAGNOSIS — R55 Syncope and collapse: Secondary | ICD-10-CM | POA: Diagnosis not present

## 2023-11-28 DIAGNOSIS — Z79890 Hormone replacement therapy (postmenopausal): Secondary | ICD-10-CM | POA: Diagnosis not present

## 2023-11-28 DIAGNOSIS — I10 Essential (primary) hypertension: Secondary | ICD-10-CM | POA: Diagnosis present

## 2023-11-28 DIAGNOSIS — J018 Other acute sinusitis: Secondary | ICD-10-CM | POA: Diagnosis present

## 2023-11-28 DIAGNOSIS — G4730 Sleep apnea, unspecified: Secondary | ICD-10-CM | POA: Diagnosis present

## 2023-11-28 DIAGNOSIS — Z79899 Other long term (current) drug therapy: Secondary | ICD-10-CM

## 2023-11-28 DIAGNOSIS — E8881 Metabolic syndrome: Secondary | ICD-10-CM | POA: Diagnosis present

## 2023-11-28 DIAGNOSIS — Z7982 Long term (current) use of aspirin: Secondary | ICD-10-CM | POA: Diagnosis not present

## 2023-11-28 DIAGNOSIS — Z7952 Long term (current) use of systemic steroids: Secondary | ICD-10-CM

## 2023-11-28 DIAGNOSIS — I669 Occlusion and stenosis of unspecified cerebral artery: Secondary | ICD-10-CM | POA: Diagnosis present

## 2023-11-28 DIAGNOSIS — K449 Diaphragmatic hernia without obstruction or gangrene: Secondary | ICD-10-CM | POA: Diagnosis present

## 2023-11-28 DIAGNOSIS — R000 Tachycardia, unspecified: Secondary | ICD-10-CM | POA: Diagnosis present

## 2023-11-28 DIAGNOSIS — K219 Gastro-esophageal reflux disease without esophagitis: Secondary | ICD-10-CM | POA: Diagnosis present

## 2023-11-28 DIAGNOSIS — I639 Cerebral infarction, unspecified: Principal | ICD-10-CM | POA: Diagnosis present

## 2023-11-28 DIAGNOSIS — E669 Obesity, unspecified: Secondary | ICD-10-CM | POA: Diagnosis present

## 2023-11-28 DIAGNOSIS — E039 Hypothyroidism, unspecified: Secondary | ICD-10-CM | POA: Diagnosis present

## 2023-11-28 DIAGNOSIS — Z6837 Body mass index (BMI) 37.0-37.9, adult: Secondary | ICD-10-CM

## 2023-11-28 DIAGNOSIS — E66812 Obesity, class 2: Secondary | ICD-10-CM | POA: Diagnosis present

## 2023-11-28 DIAGNOSIS — E785 Hyperlipidemia, unspecified: Secondary | ICD-10-CM | POA: Diagnosis present

## 2023-11-28 DIAGNOSIS — I6601 Occlusion and stenosis of right middle cerebral artery: Secondary | ICD-10-CM | POA: Diagnosis present

## 2023-11-28 DIAGNOSIS — I6521 Occlusion and stenosis of right carotid artery: Secondary | ICD-10-CM | POA: Diagnosis present

## 2023-11-28 DIAGNOSIS — Z880 Allergy status to penicillin: Secondary | ICD-10-CM

## 2023-11-28 DIAGNOSIS — R29700 NIHSS score 0: Secondary | ICD-10-CM | POA: Diagnosis present

## 2023-11-28 DIAGNOSIS — I6389 Other cerebral infarction: Secondary | ICD-10-CM | POA: Diagnosis not present

## 2023-11-28 DIAGNOSIS — I6381 Other cerebral infarction due to occlusion or stenosis of small artery: Principal | ICD-10-CM | POA: Diagnosis present

## 2023-11-28 DIAGNOSIS — I6609 Occlusion and stenosis of unspecified middle cerebral artery: Secondary | ICD-10-CM | POA: Diagnosis present

## 2023-11-28 DIAGNOSIS — I700 Atherosclerosis of aorta: Secondary | ICD-10-CM | POA: Diagnosis present

## 2023-11-28 DIAGNOSIS — Z9049 Acquired absence of other specified parts of digestive tract: Secondary | ICD-10-CM

## 2023-11-28 LAB — HIV ANTIBODY (ROUTINE TESTING W REFLEX): HIV Screen 4th Generation wRfx: NONREACTIVE

## 2023-11-28 LAB — COMPREHENSIVE METABOLIC PANEL
ALT: 26 U/L (ref 0–44)
AST: 27 U/L (ref 15–41)
Albumin: 3.8 g/dL (ref 3.5–5.0)
Alkaline Phosphatase: 94 U/L (ref 38–126)
Anion gap: 11 (ref 5–15)
BUN: 13 mg/dL (ref 6–20)
CO2: 23 mmol/L (ref 22–32)
Calcium: 8.9 mg/dL (ref 8.9–10.3)
Chloride: 102 mmol/L (ref 98–111)
Creatinine, Ser: 0.91 mg/dL (ref 0.44–1.00)
GFR, Estimated: >60 mL/min (ref >60)
Glucose, Bld: 117 mg/dL — ABNORMAL HIGH (ref 70–99)
Potassium: 3.7 mmol/L (ref 3.5–5.1)
Sodium: 136 mmol/L (ref 135–145)
Total Bilirubin: 1.2 mg/dL — ABNORMAL HIGH (ref <1.2)
Total Protein: 7.5 g/dL (ref 6.5–8.1)

## 2023-11-28 LAB — CREATININE, SERUM
Creatinine, Ser: 0.83 mg/dL (ref 0.44–1.00)
GFR, Estimated: >60 mL/min (ref >60)

## 2023-11-28 LAB — CBC
HCT: 41 % (ref 36.0–46.0)
Hemoglobin: 13.3 g/dL (ref 12.0–15.0)
MCH: 26.4 pg (ref 26.0–34.0)
MCHC: 32.4 g/dL (ref 30.0–36.0)
MCV: 81.3 fL (ref 80.0–100.0)
Platelets: 305 10*3/uL (ref 150–400)
RBC: 5.04 MIL/uL (ref 3.87–5.11)
RDW: 14.1 % (ref 11.5–15.5)
WBC: 10.1 10*3/uL (ref 4.0–10.5)
nRBC: 0 % (ref 0.0–0.2)

## 2023-11-28 LAB — CBC WITH DIFFERENTIAL/PLATELET
Abs Immature Granulocytes: 0.04 10*3/uL (ref 0.00–0.07)
Basophils Absolute: 0 10*3/uL (ref 0.0–0.1)
Basophils Relative: 0 %
Eosinophils Absolute: 0.1 10*3/uL (ref 0.0–0.5)
Eosinophils Relative: 1 %
HCT: 41.6 % (ref 36.0–46.0)
Hemoglobin: 13 g/dL (ref 12.0–15.0)
Immature Granulocytes: 0 %
Lymphocytes Relative: 13 %
Lymphs Abs: 1.4 10*3/uL (ref 0.7–4.0)
MCH: 25.5 pg — ABNORMAL LOW (ref 26.0–34.0)
MCHC: 31.3 g/dL (ref 30.0–36.0)
MCV: 81.6 fL (ref 80.0–100.0)
Monocytes Absolute: 0.7 10*3/uL (ref 0.1–1.0)
Monocytes Relative: 7 %
Neutro Abs: 8.1 10*3/uL — ABNORMAL HIGH (ref 1.7–7.7)
Neutrophils Relative %: 79 %
Platelets: 281 10*3/uL (ref 150–400)
RBC: 5.1 MIL/uL (ref 3.87–5.11)
RDW: 13.8 % (ref 11.5–15.5)
WBC: 10.4 10*3/uL (ref 4.0–10.5)
nRBC: 0 % (ref 0.0–0.2)

## 2023-11-28 LAB — TROPONIN I (HIGH SENSITIVITY)
Troponin I (High Sensitivity): <2 ng/L (ref <18)
Troponin I (High Sensitivity): <2 ng/L (ref <18)

## 2023-11-28 LAB — MAGNESIUM: Magnesium: 1.9 mg/dL (ref 1.7–2.4)

## 2023-11-28 LAB — CBG MONITORING, ED: Glucose-Capillary: 102 mg/dL — ABNORMAL HIGH (ref 70–99)

## 2023-11-28 MED ORDER — ONDANSETRON HCL 4 MG/2ML IJ SOLN
4.0000 mg | Freq: Once | INTRAMUSCULAR | Status: AC
  Administered 2023-11-28: 4 mg via INTRAVENOUS
  Filled 2023-11-28: qty 2

## 2023-11-28 MED ORDER — BISACODYL 5 MG PO TBEC: 5.0000 mg | DELAYED_RELEASE_TABLET | Freq: Every day | ORAL | Status: DC | PRN

## 2023-11-28 MED ORDER — ENOXAPARIN SODIUM 60 MG/0.6ML IJ SOSY
0.5000 mg/kg | PREFILLED_SYRINGE | INTRAMUSCULAR | Status: DC
  Administered 2023-11-28: 50 mg via SUBCUTANEOUS
  Filled 2023-11-28: qty 0.6

## 2023-11-28 MED ORDER — ASPIRIN 81 MG PO TBEC
81.0000 mg | DELAYED_RELEASE_TABLET | Freq: Every day | ORAL | Status: DC
  Administered 2023-11-28 – 2023-11-29 (×2): 81 mg via ORAL
  Filled 2023-11-28 (×2): qty 1

## 2023-11-28 MED ORDER — LEVOTHYROXINE SODIUM 88 MCG PO TABS
88.0000 ug | ORAL_TABLET | Freq: Every day | ORAL | Status: DC
  Administered 2023-11-29: 88 ug via ORAL
  Filled 2023-11-28: qty 1

## 2023-11-28 MED ORDER — ALPRAZOLAM 0.5 MG PO TABS: 0.5000 mg | ORAL_TABLET | Freq: Every evening | ORAL | Status: DC | PRN

## 2023-11-28 MED ORDER — ACETAMINOPHEN 325 MG PO TABS
650.0000 mg | ORAL_TABLET | Freq: Four times a day (QID) | ORAL | Status: DC | PRN
Start: 2023-11-28 — End: 2023-11-29
  Administered 2023-11-29 (×2): 650 mg via ORAL
  Filled 2023-11-28 (×2): qty 2

## 2023-11-28 MED ORDER — ACETAMINOPHEN 650 MG RE SUPP: 650.0000 mg | Freq: Four times a day (QID) | RECTAL | Status: DC | PRN

## 2023-11-28 MED ORDER — POLYETHYLENE GLYCOL 3350 17 G PO PACK: 17.0000 g | PACK | Freq: Every day | ORAL | Status: DC

## 2023-11-28 MED ORDER — CLOPIDOGREL BISULFATE 75 MG PO TABS
75.0000 mg | ORAL_TABLET | Freq: Every day | ORAL | Status: DC
  Administered 2023-11-28 – 2023-11-29 (×2): 75 mg via ORAL
  Filled 2023-11-28 (×2): qty 1

## 2023-11-28 MED ORDER — IOHEXOL 350 MG/ML SOLN
75.0000 mL | Freq: Once | INTRAVENOUS | Status: AC | PRN
  Administered 2023-11-28: 75 mL via INTRAVENOUS

## 2023-11-28 MED ORDER — LACTATED RINGERS IV BOLUS
1000.0000 mL | Freq: Once | INTRAVENOUS | Status: AC
  Administered 2023-11-28: 1000 mL via INTRAVENOUS

## 2023-11-28 NOTE — ED Provider Notes (Addendum)
Patient turned over to me.  Pending CT angio head and neck results.  MRI did show evidence of an acute infarct.  On-call neurology Dr. Iver Nestle was awaiting the CT angio results to determine whether patient could be admitted to any pain or admitted to [facility].    Results for orders placed or performed during the hospital encounter of 11/28/23  CBC WITH DIFFERENTIAL   Collection Time: 11/28/23 11:41 AM  Result Value Ref Range   WBC 10.4 4.0 - 10.5 K/uL   RBC 5.10 3.87 - 5.11 MIL/uL   Hemoglobin 13.0 12.0 - 15.0 g/dL   HCT 66.0 63.0 - 16.0 %   MCV 81.6 80.0 - 100.0 fL   MCH 25.5 (L) 26.0 - 34.0 pg   MCHC 31.3 30.0 - 36.0 g/dL   RDW 10.9 32.3 - 55.7 %   Platelets 281 150 - 400 K/uL   nRBC 0.0 0.0 - 0.2 %   Neutrophils Relative % 79 %   Neutro Abs 8.1 (H) 1.7 - 7.7 K/uL   Lymphocytes Relative 13 %   Lymphs Abs 1.4 0.7 - 4.0 K/uL   Monocytes Relative 7 %   Monocytes Absolute 0.7 0.1 - 1.0 K/uL   Eosinophils Relative 1 %   Eosinophils Absolute 0.1 0.0 - 0.5 K/uL   Basophils Relative 0 %   Basophils Absolute 0.0 0.0 - 0.1 K/uL   Immature Granulocytes 0 %   Abs Immature Granulocytes 0.04 0.00 - 0.07 K/uL  Comprehensive metabolic panel   Collection Time: 11/28/23 11:41 AM  Result Value Ref Range   Sodium 136 135 - 145 mmol/L   Potassium 3.7 3.5 - 5.1 mmol/L   Chloride 102 98 - 111 mmol/L   CO2 23 22 - 32 mmol/L   Glucose, Bld 117 (H) 70 - 99 mg/dL   BUN 13 6 - 20 mg/dL   Creatinine, Ser 3.22 0.44 - 1.00 mg/dL   Calcium 8.9 8.9 - 02.5 mg/dL   Total Protein 7.5 6.5 - 8.1 g/dL   Albumin 3.8 3.5 - 5.0 g/dL   AST 27 15 - 41 U/L   ALT 26 0 - 44 U/L   Alkaline Phosphatase 94 38 - 126 U/L   Total Bilirubin 1.2 (H) <1.2 mg/dL   GFR, Estimated >42 >70 mL/min   Anion gap 11 5 - 15  Magnesium   Collection Time: 11/28/23 11:41 AM  Result Value Ref Range   Magnesium 1.9 1.7 - 2.4 mg/dL  Troponin I (High Sensitivity)   Collection Time: 11/28/23 11:41 AM  Result Value Ref Range   Troponin I  (High Sensitivity) <2 <18 ng/L  CBG monitoring, ED   Collection Time: 11/28/23 12:01 PM  Result Value Ref Range   Glucose-Capillary 102 (H) 70 - 99 mg/dL  Troponin I (High Sensitivity)   Collection Time: 11/28/23  1:26 PM  Result Value Ref Range   Troponin I (High Sensitivity) <2 <18 ng/L   CT ANGIO HEAD NECK W WO CM Result Date: 11/28/2023 CLINICAL DATA:  Abnormal right M1 segment by MR angiography, concern for high-grade stenosis. EXAM: CT ANGIOGRAPHY HEAD AND NECK WITH AND WITHOUT CONTRAST TECHNIQUE: Multidetector CT imaging of the head and neck was performed using the standard protocol during bolus administration of intravenous contrast. Multiplanar CT image reconstructions and MIPs were obtained to evaluate the vascular anatomy. Carotid stenosis measurements (when applicable) are obtained utilizing NASCET criteria, using the distal internal carotid diameter as the denominator. RADIATION DOSE REDUCTION: This exam was performed according to the  departmental dose-optimization program which includes automated exposure control, adjustment of the mA and/or kV according to patient size and/or use of iterative reconstruction technique. CONTRAST:  75mL OMNIPAQUE IOHEXOL 350 MG/ML SOLN COMPARISON:  CT and MRI studies earlier same day. FINDINGS: CTA NECK FINDINGS Aortic arch: Normal Right carotid system: Common carotid artery widely patent to the bifurcation. Mild soft plaque at the bifurcation but no stenosis. Cervical ICA widely patent. Left carotid system: Left carotid system is normal. Vertebral arteries: Both vertebral artery origins are widely patent. Both vessels are normal through the cervical region to the foramen magnum. Skeleton: Normal Other neck: No mass or lymphadenopathy. Upper chest: Lung apices are clear. Review of the MIP images confirms the above findings CTA HEAD FINDINGS Anterior circulation: Both internal carotid arteries are patent through the skull base and siphon regions. The left  anterior and middle cerebral arteries are widely patent. On the right, there is severe stenosis in the M1 segment as suggested by MRI. Patient would be at risk of right MCA occlusion. Posterior circulation: Both vertebral arteries are patent to the basilar artery. No basilar stenosis. Posterior circulation branch vessels are patent. Venous sinuses: Patent and normal. Anatomic variants: None significant. Review of the MIP images confirms the above findings IMPRESSION: 1. Severe focal stenosis in the M1 segment of the right middle cerebral artery as suggested by MRI. Patient would be at risk of right MCA occlusion. 2. Mild soft plaque at the right carotid bifurcation but no stenosis. 3. No other intracranial stenosis. Electronically Signed   By: Paulina Fusi M.D.   On: 11/28/2023 16:21   MR BRAIN WO CONTRAST Result Date: 11/28/2023 CLINICAL DATA:  Transient ischemic attack (TIA) EXAM: MRI HEAD WITHOUT CONTRAST MRA HEAD WITHOUT CONTRAST TECHNIQUE: Multiplanar, multi-echo pulse sequences of the brain and surrounding structures were acquired without intravenous contrast. Angiographic images of the Circle of Willis were acquired using MRA technique without intravenous contrast. COMPARISON:  See same day CT head FINDINGS: MRI HEAD FINDINGS Brain: Punctate acute infarct in the periatrial white matter on the left (series 6, image 23). No hemorrhage. No hydrocephalus. No extra-axial fluid collection. No mass effect. No mass lesion. Vascular: See below Skull and upper cervical spine: Normal marrow signal. Sinuses/Orbits: No middle ear effusion. Trace right mastoid effusion. Mucosal thickening and air-fluid levels in the bilateral sphenoid and left maxillary sinus. Orbits are unremarkable. Other: None. MRA HEAD FINDINGS Anterior circulation: There is loss of the normal flow signal in the M1 segment of the right MCA (series 1009, image 7). This is worrisome for a site of high-grade stenosis Posterior circulation: The V4  segment of the right vertebral artery is not visualized. Otherwise there is no evidence of aneurysm or vascular malformation. Bilateral PCAs are without evidence of proximal occlusion or significant stenosis. Anatomic variants: None IMPRESSION: 1. Punctate acute infarct in the periatrial white matter on the left. 2. Loss of the normal flow signal in the M1 segment of the right MCA, worrisome for a site high-grade stenosis. Recommend further evaluation with dedicated CTA head and neck angiogram. 3. Mucosal thickening and air-fluid levels in the bilateral sphenoid and left maxillary sinus, which can be seen in the setting of acute sinusitis. Electronically Signed   By: Lorenza Cambridge M.D.   On: 11/28/2023 15:19   MR ANGIO HEAD WO CONTRAST Result Date: 11/28/2023 CLINICAL DATA:  Transient ischemic attack (TIA) EXAM: MRI HEAD WITHOUT CONTRAST MRA HEAD WITHOUT CONTRAST TECHNIQUE: Multiplanar, multi-echo pulse sequences of the brain and surrounding structures  were acquired without intravenous contrast. Angiographic images of the Circle of Willis were acquired using MRA technique without intravenous contrast. COMPARISON:  See same day CT head FINDINGS: MRI HEAD FINDINGS Brain: Punctate acute infarct in the periatrial white matter on the left (series 6, image 23). No hemorrhage. No hydrocephalus. No extra-axial fluid collection. No mass effect. No mass lesion. Vascular: See below Skull and upper cervical spine: Normal marrow signal. Sinuses/Orbits: No middle ear effusion. Trace right mastoid effusion. Mucosal thickening and air-fluid levels in the bilateral sphenoid and left maxillary sinus. Orbits are unremarkable. Other: None. MRA HEAD FINDINGS Anterior circulation: There is loss of the normal flow signal in the M1 segment of the right MCA (series 1009, image 7). This is worrisome for a site of high-grade stenosis Posterior circulation: The V4 segment of the right vertebral artery is not visualized. Otherwise there is  no evidence of aneurysm or vascular malformation. Bilateral PCAs are without evidence of proximal occlusion or significant stenosis. Anatomic variants: None IMPRESSION: 1. Punctate acute infarct in the periatrial white matter on the left. 2. Loss of the normal flow signal in the M1 segment of the right MCA, worrisome for a site high-grade stenosis. Recommend further evaluation with dedicated CTA head and neck angiogram. 3. Mucosal thickening and air-fluid levels in the bilateral sphenoid and left maxillary sinus, which can be seen in the setting of acute sinusitis. Electronically Signed   By: Lorenza Cambridge M.D.   On: 11/28/2023 15:19   DG Chest Port 1 View Result Date: 11/28/2023 CLINICAL DATA:  Syncope.  Hypothyroidism. EXAM: PORTABLE CHEST 1 VIEW COMPARISON:  Radiographs 10/30/2023 and 02/16/2023. FINDINGS: 1153 hours. The heart size and mediastinal contours are normal. The lungs are clear. There is no pleural effusion or pneumothorax. No acute osseous findings are identified. Telemetry leads overlie the chest. IMPRESSION: No evidence of acute cardiopulmonary process. Electronically Signed   By: Carey Bullocks M.D.   On: 11/28/2023 12:38   CT HEAD WO CONTRAST Result Date: 11/28/2023 CLINICAL DATA:  Syncope/presyncope, cerebrovascular cause suspected. EXAM: CT HEAD WITHOUT CONTRAST TECHNIQUE: Contiguous axial images were obtained from the base of the skull through the vertex without intravenous contrast. RADIATION DOSE REDUCTION: This exam was performed according to the departmental dose-optimization program which includes automated exposure control, adjustment of the mA and/or kV according to patient size and/or use of iterative reconstruction technique. COMPARISON:  None Available. FINDINGS: Brain: There is no evidence of an acute infarct, intracranial hemorrhage, mass, midline shift, or extra-axial fluid collection. Cerebral volume is normal with normal size of the ventricles. Vascular: No hyperdense  vessel. Skull: No acute fracture or suspicious osseous lesion. Sinuses/Orbits: Mild mucosal thickening and secretions in the sphenoid sinuses. Clear mastoid air cells. Unremarkable included orbits. Other: None. IMPRESSION: No evidence of acute intracranial abnormality. Electronically Signed   By: Sebastian Ache M.D.   On: 11/28/2023 12:33   DG Chest 2 View Result Date: 10/30/2023 CLINICAL DATA:  Cough and chest congestion for 3 days. EXAM: CHEST - 2 VIEW COMPARISON:  02/16/2023 FINDINGS: The heart size and mediastinal contours are within normal limits. Both lungs are clear. The visualized skeletal structures are unremarkable. IMPRESSION: No active cardiopulmonary disease. Electronically Signed   By: Danae Orleans M.D.   On: 10/30/2023 11:15      Vanetta Mulders, MD 11/28/23 1634  Discussed with on-call neurology Dr. Iver Nestle patient stable for admission here.  Patient currently has no neurodeficits.  Still feels somewhat generalized weakness.  Patient has visual changes right  prior to the syncopal episode but has none now.  Did not have any earlier.    Vanetta Mulders, MD 11/28/23 [phone]

## 2023-11-28 NOTE — H&P (Signed)
History and Physical  [name] ZOX:[id number] DOB: 30-Dec-1965 DOA: 11/28/2023 PCP: Tommie Sams, DO  Chief Complaint: syncope Historian: patient  HPI:  [name] is a 57 y.o. female with a PMH significant for GERD, hypothyroidism, prediabetes, IBS, migraines, sleep apnea. At baseline, they live at home with her husband and and are completely independent with ADLs.  They presented from work to the ED on 11/28/2023 with an episode of syncope.  Patient felt at her normal baseline this morning and proceeded to go to work.  While at work, she began to feel flushed, nauseous and slightly lightheaded so told her employer that she was going to go home.  While she was walking from her employer's office back to her own which is a short hallway, she felt herself becoming lightheaded and started to see little black spots in her vision and remembers falling to her hands and knees.  The next thing she remembers is a coworker standing above her likely a few seconds later and she felt lightheaded at that time.  She denies any appreciated weakness, pain after her fall. She denies any recent illnesses, medication changes or changes in her daily routines.  Did not appreciate any flutter or palpitations. Has no history of stroke, known cardiac arrhythmia. She does not take birth control nor hormone replacement.  She is not a smoker. At the time of our encounter, she feels like she is at her normal baseline other than feeling generalized tiredness.  In the ED, it was found that they had mild tachycardia up to 108 heart rate, hypertension up to 172/101.  Normal O2 sats on room air and afebrile..  Significant findings included: Unremarkable CBC, and unremarkable metabolic panel.  Troponins are negative x 2. ECG sinus tachycardia Chest x-ray negative acute process CT head: Negative for acute intracranial abnormality Brain MRI: Acute punctate infarct on the left periatrial white matter, Loss of the normal  flow signal in the M1 segment of the right MCA, worrisome for a site high-grade stenosis. Recommend further evaluation with dedicated CTA head and neck angiogram. 3. Mucosal thickening and air-fluid levels in the bilateral sphenoid and left maxillary sinus, which can be seen in the setting of acute sinusitis. CTA head/neck: Severe focal stenosis in the M1 segment of the right middle cerebral artery as suggested by MRI. Patient would be at risk of right MCA occlusion. 2. Mild soft plaque at the right carotid bifurcation but no stenosis. 3. No other intracranial stenosis.  They were initially treated with nothing. Neurology was consulted and recommended admission to [facility].   Patient was admitted to medicine service for further workup and management of acute CVA as outlined in detail below.  Assessment/Plan Principal Problem:   Acute CVA (cerebrovascular accident) [facility]) Active Problems:   Syncope and collapse   Primary hypertension   Stenosis of right carotid artery   Cerebrovascular accident (CVA) (HCC)   Acute CVA of left periatrial white matter  severe stenosis of right middle cerebral artery-she does not appear to have any corresponding symptoms with the brain imaging.  Risk factors limited to primarily of metabolic syndrome. -Nephrology following, appreciate your care -Starting aspirin and Plavix -Continuous telemetry -Risk stratification labs ordered for the a.m. -Echo -Likely will need vascular consult in the future -Permissive hypertension up to 220 systolic  Syncope-potentially could be related to unilateral stenosis but is very unlikely.  Sounds like it is probably more related to acute orthostatic symptoms with a viral onset and changing  in positions from office to office.  There is evidence of acute sinusitis on her head imaging. -Supportive care as needed  Hypothyroidism-endorses adherence with her medications -TSH pending -Continue home levothyroxine  Past  Medical History:  Diagnosis Date   GERD (gastroesophageal reflux disease)    Hypothyroidism    IBS (irritable bowel syndrome)    S/P colonoscopy 07/13/2010   [place]: no results available. Reportedly done because heme +    Past Surgical History:  Procedure Laterality Date   CESAREAN SECTION     X 2   CHOLECYSTECTOMY     KNEE SURGERY       reports that she has never smoked. She has never used smokeless tobacco. She reports that she does not drink alcohol and does not use drugs.  Allergies  Allergen Reactions   Erythromycin Hives   Amoxil [Amoxicillin] Rash    Rash on palms of hands; peeling of hands     Family History  Problem Relation Age of Onset   Diabetes Father    Colon cancer Neg Hx    Esophageal cancer Neg Hx    Rectal cancer Neg Hx    Stomach cancer Neg Hx     Prior to Admission medications   Medication Sig Start Date End Date Taking? Authorizing Provider  ALPRAZolam Prudy Feeler) 0.5 MG tablet Take 0.5 mg by mouth at bedtime as needed for anxiety.    [provider]  famotidine (PEPCID) 20 MG tablet Take 1 tablet (20 mg total) by mouth 2 (two) times daily. Patient taking differently: Take 20 mg by mouth as needed. 02/16/23   Gerhard Munch, MD  levothyroxine (SYNTHROID) 88 MCG tablet Take 1 tablet (88 mcg total) by mouth daily before breakfast. 05/27/23   Tommie Sams, DO  pantoprazole (PROTONIX) 40 MG tablet Take 1 tablet (40 mg total) by mouth daily. 06/02/23   Zehr, Princella Pellegrini, PA-C  predniSONE (DELTASONE) 20 MG tablet Take 2 tablets (40 mg total) by mouth daily with breakfast. 10/30/23   Particia Nearing, PA-C  promethazine-dextromethorphan (PROMETHAZINE-DM) 6.25-15 MG/5ML syrup Take 5 mLs by mouth 4 (four) times daily as needed. 10/30/23   Particia Nearing, PA-C  sucralfate (CARAFATE) 1 g tablet Take 1 tablet (1 g total) by mouth 4 (four) times daily -  with meals and at bedtime. Patient taking differently: Take 1 g by mouth as needed. 02/16/23    Gerhard Munch, MD   I have personally, briefly reviewed patient's prior medical records in [place] [facility]  Objective: Blood pressure (!) 169/103, pulse (!) 108, temperature 98 F (36.7 C), resp. rate 20, height 5\' 5"  (1.651 m), weight 99.8 kg, last menstrual period 01/11/2016, SpO2 97%.   Constitutional: NAD, calm, comfortable HEENT: lids and conjunctivae normal. MMM. Posterior pharynx clear of any exudate or lesions. Normal dentition.  Neck: normal, supple, no masses, no thyromegaly Respiratory: CTAB, no wheezing, no crackles. Normal respiratory effort. No accessory muscle use.  Cardiovascular: RRR, no murmurs / rubs / gallops. No extremity edema. 2+ pedal pulses. no clubbing / cyanosis.  Abdomen: soft, NT, ND, no masses or HSM palpated. Musculoskeletal: No joint deformity upper and lower extremities. Normal muscle tone.  Skin: dry, intact, normal color, normal temperature on exposed skin Neuro:  Alert and oriented to person, place, and date.  CN II-XI intact.  Sensation intact to light touch, pain, and vibration bilateral upper and lower extremities equally.  Motor function equal and strength 5/5 bilateral upper and lower extremities.  Normal gait.  Finger to nose cerebellar testing within normal limits.   Psychiatric: Normal mood. Congruent affect.  Labs on Admission: I have personally reviewed admission labs and imaging studies  CBC    Component Value Date/Time   WBC 10.1 11/28/2023 1819   RBC 5.04 11/28/2023 1819   HGB 13.3 11/28/2023 1819   HGB 13.8 12/24/2021 0845   HCT 41.0 11/28/2023 1819   HCT 43.1 12/24/2021 0845   PLT 305 11/28/2023 1819   PLT 364 12/24/2021 0845   MCV 81.3 11/28/2023 1819   MCV 78 (L) 12/24/2021 0845   MCH 26.4 11/28/2023 1819   MCHC 32.4 11/28/2023 1819   RDW 14.1 11/28/2023 1819   RDW 13.7 12/24/2021 0845   LYMPHSABS 1.4 11/28/2023 1141   LYMPHSABS 1.8 10/21/2016 0829   MONOABS 0.7 11/28/2023 1141   EOSABS 0.1 11/28/2023 1141   EOSABS  0.3 10/21/2016 0829   BASOSABS 0.0 11/28/2023 1141   BASOSABS 0.0 10/21/2016 0829   CMP     Component Value Date/Time   NA 136 11/28/2023 1141   NA 145 (H) 12/24/2021 0845   K 3.7 11/28/2023 1141   CL 102 11/28/2023 1141   CO2 23 11/28/2023 1141   GLUCOSE 117 (H) 11/28/2023 1141   BUN 13 11/28/2023 1141   BUN 12 12/24/2021 0845   CREATININE 0.83 11/28/2023 1819   CALCIUM 8.9 11/28/2023 1141   PROT 7.5 11/28/2023 1141   PROT 7.1 12/24/2021 0845   ALBUMIN 3.8 11/28/2023 1141   ALBUMIN 4.4 12/24/2021 0845   AST 27 11/28/2023 1141   ALT 26 11/28/2023 1141   ALKPHOS 94 11/28/2023 1141   BILITOT 1.2 (H) 11/28/2023 1141   BILITOT 1.2 12/24/2021 0845   GFRNONAA >60 11/28/2023 1819   GFRAA 61 01/04/2020 0817    Radiological Exams on Admission: CT ANGIO HEAD NECK W WO CM Result Date: 11/28/2023 CLINICAL DATA:  Abnormal right M1 segment by MR angiography, concern for high-grade stenosis. EXAM: CT ANGIOGRAPHY HEAD AND NECK WITH AND WITHOUT CONTRAST TECHNIQUE: Multidetector CT imaging of the head and neck was performed using the standard protocol during bolus administration of intravenous contrast. Multiplanar CT image reconstructions and MIPs were obtained to evaluate the vascular anatomy. Carotid stenosis measurements (when applicable) are obtained utilizing NASCET criteria, using the distal internal carotid diameter as the denominator. RADIATION DOSE REDUCTION: This exam was performed according to the departmental dose-optimization program which includes automated exposure control, adjustment of the mA and/or kV according to patient size and/or use of iterative reconstruction technique. CONTRAST:  75mL OMNIPAQUE IOHEXOL 350 MG/ML SOLN COMPARISON:  CT and MRI studies earlier same day. FINDINGS: CTA NECK FINDINGS Aortic arch: Normal Right carotid system: Common carotid artery widely patent to the bifurcation. Mild soft plaque at the bifurcation but no stenosis. Cervical ICA widely patent. Left  carotid system: Left carotid system is normal. Vertebral arteries: Both vertebral artery origins are widely patent. Both vessels are normal through the cervical region to the foramen magnum. Skeleton: Normal Other neck: No mass or lymphadenopathy. Upper chest: Lung apices are clear. Review of the MIP images confirms the above findings CTA HEAD FINDINGS Anterior circulation: Both internal carotid arteries are patent through the skull base and siphon regions. The left anterior and middle cerebral arteries are widely patent. On the right, there is severe stenosis in the M1 segment as suggested by MRI. Patient would be at risk of right MCA occlusion. Posterior circulation: Both vertebral arteries are patent to the basilar artery. No basilar stenosis. Posterior circulation  branch vessels are patent. Venous sinuses: Patent and normal. Anatomic variants: None significant. Review of the MIP images confirms the above findings IMPRESSION: 1. Severe focal stenosis in the M1 segment of the right middle cerebral artery as suggested by MRI. Patient would be at risk of right MCA occlusion. 2. Mild soft plaque at the right carotid bifurcation but no stenosis. 3. No other intracranial stenosis. Electronically Signed   By: Paulina Fusi M.D.   On: 11/28/2023 16:21   MR BRAIN WO CONTRAST Result Date: 11/28/2023 CLINICAL DATA:  Transient ischemic attack (TIA) EXAM: MRI HEAD WITHOUT CONTRAST MRA HEAD WITHOUT CONTRAST TECHNIQUE: Multiplanar, multi-echo pulse sequences of the brain and surrounding structures were acquired without intravenous contrast. Angiographic images of the Circle of Willis were acquired using MRA technique without intravenous contrast. COMPARISON:  See same day CT head FINDINGS: MRI HEAD FINDINGS Brain: Punctate acute infarct in the periatrial white matter on the left (series 6, image 23). No hemorrhage. No hydrocephalus. No extra-axial fluid collection. No mass effect. No mass lesion. Vascular: See below Skull  and upper cervical spine: Normal marrow signal. Sinuses/Orbits: No middle ear effusion. Trace right mastoid effusion. Mucosal thickening and air-fluid levels in the bilateral sphenoid and left maxillary sinus. Orbits are unremarkable. Other: None. MRA HEAD FINDINGS Anterior circulation: There is loss of the normal flow signal in the M1 segment of the right MCA (series 1009, image 7). This is worrisome for a site of high-grade stenosis Posterior circulation: The V4 segment of the right vertebral artery is not visualized. Otherwise there is no evidence of aneurysm or vascular malformation. Bilateral PCAs are without evidence of proximal occlusion or significant stenosis. Anatomic variants: None IMPRESSION: 1. Punctate acute infarct in the periatrial white matter on the left. 2. Loss of the normal flow signal in the M1 segment of the right MCA, worrisome for a site high-grade stenosis. Recommend further evaluation with dedicated CTA head and neck angiogram. 3. Mucosal thickening and air-fluid levels in the bilateral sphenoid and left maxillary sinus, which can be seen in the setting of acute sinusitis. Electronically Signed   By: Lorenza Cambridge M.D.   On: 11/28/2023 15:19   MR ANGIO HEAD WO CONTRAST Result Date: 11/28/2023 CLINICAL DATA:  Transient ischemic attack (TIA) EXAM: MRI HEAD WITHOUT CONTRAST MRA HEAD WITHOUT CONTRAST TECHNIQUE: Multiplanar, multi-echo pulse sequences of the brain and surrounding structures were acquired without intravenous contrast. Angiographic images of the Circle of Willis were acquired using MRA technique without intravenous contrast. COMPARISON:  See same day CT head FINDINGS: MRI HEAD FINDINGS Brain: Punctate acute infarct in the periatrial white matter on the left (series 6, image 23). No hemorrhage. No hydrocephalus. No extra-axial fluid collection. No mass effect. No mass lesion. Vascular: See below Skull and upper cervical spine: Normal marrow signal. Sinuses/Orbits: No middle  ear effusion. Trace right mastoid effusion. Mucosal thickening and air-fluid levels in the bilateral sphenoid and left maxillary sinus. Orbits are unremarkable. Other: None. MRA HEAD FINDINGS Anterior circulation: There is loss of the normal flow signal in the M1 segment of the right MCA (series 1009, image 7). This is worrisome for a site of high-grade stenosis Posterior circulation: The V4 segment of the right vertebral artery is not visualized. Otherwise there is no evidence of aneurysm or vascular malformation. Bilateral PCAs are without evidence of proximal occlusion or significant stenosis. Anatomic variants: None IMPRESSION: 1. Punctate acute infarct in the periatrial white matter on the left. 2. Loss of the normal flow signal in the  M1 segment of the right MCA, worrisome for a site high-grade stenosis. Recommend further evaluation with dedicated CTA head and neck angiogram. 3. Mucosal thickening and air-fluid levels in the bilateral sphenoid and left maxillary sinus, which can be seen in the setting of acute sinusitis. Electronically Signed   By: Lorenza Cambridge M.D.   On: 11/28/2023 15:19   DG Chest Port 1 View Result Date: 11/28/2023 CLINICAL DATA:  Syncope.  Hypothyroidism. EXAM: PORTABLE CHEST 1 VIEW COMPARISON:  Radiographs 10/30/2023 and 02/16/2023. FINDINGS: 1153 hours. The heart size and mediastinal contours are normal. The lungs are clear. There is no pleural effusion or pneumothorax. No acute osseous findings are identified. Telemetry leads overlie the chest. IMPRESSION: No evidence of acute cardiopulmonary process. Electronically Signed   By: Carey Bullocks M.D.   On: 11/28/2023 12:38   CT HEAD WO CONTRAST Result Date: 11/28/2023 CLINICAL DATA:  Syncope/presyncope, cerebrovascular cause suspected. EXAM: CT HEAD WITHOUT CONTRAST TECHNIQUE: Contiguous axial images were obtained from the base of the skull through the vertex without intravenous contrast. RADIATION DOSE REDUCTION: This exam was  performed according to the departmental dose-optimization program which includes automated exposure control, adjustment of the mA and/or kV according to patient size and/or use of iterative reconstruction technique. COMPARISON:  None Available. FINDINGS: Brain: There is no evidence of an acute infarct, intracranial hemorrhage, mass, midline shift, or extra-axial fluid collection. Cerebral volume is normal with normal size of the ventricles. Vascular: No hyperdense vessel. Skull: No acute fracture or suspicious osseous lesion. Sinuses/Orbits: Mild mucosal thickening and secretions in the sphenoid sinuses. Clear mastoid air cells. Unremarkable included orbits. Other: None. IMPRESSION: No evidence of acute intracranial abnormality. Electronically Signed   By: Sebastian Ache M.D.   On: 11/28/2023 12:33    EKG: Independently reviewed. Sinus tachycardia  DVT prophylaxis: lovenox  Code Status: full  Family Communication: husband at bedside   Disposition Plan: admit to telemetry floor  Consults called: neurology    Leeroy Bock, DO [facility]  11/28/2023, 7:06 PM    To contact the appropriate [facility] Attending or Consulting provider: Check [email or web address] for coverage from 7pm-7am

## 2023-11-28 NOTE — ED Notes (Signed)
ED TO INPATIENT HANDOFF REPORT  ED Nurse Name and Phone #: CAt  S Name/Age/Gender [name] 57 y.o. female Room/Bed: APA09/APA09  Code Status   Code Status: Full Code  Home/SNF/Other Home Patient oriented to: self, place, time, and situation Is this baseline? Yes   Triage Complete: Triage complete  Chief Complaint Acute CVA (cerebrovascular accident) [facility]) [I63.9]  Triage Note Per EMS  Passed Out    Allergies Allergies  Allergen Reactions   Erythromycin Hives   Amoxil [Amoxicillin] Rash    Rash on palms of hands; peeling of hands     Level of Care/Admitting Diagnosis ED Disposition     ED Disposition  Admit   Condition  --   Comment  Hospital Area: [facility] [[id number]]  Level of Care: Telemetry [5]  Covid Evaluation: Asymptomatic - no recent exposure (last 10 days) testing not required  Diagnosis: Acute CVA (cerebrovascular accident) [facility]) [[id number]]  Admitting Physician: Leeroy Bock [[id number]]  Attending Physician: Leeroy Bock [[id number]]  Certification:: I certify this patient will need inpatient services for at least 2 midnights  Expected Medical Readiness: 11/30/2023          B Medical/Surgery History Past Medical History:  Diagnosis Date   GERD (gastroesophageal reflux disease)    Hypothyroidism    IBS (irritable bowel syndrome)    S/P colonoscopy 07/13/2010   [place]: no results available. Reportedly done because heme +   Past Surgical History:  Procedure Laterality Date   CESAREAN SECTION     X 2   CHOLECYSTECTOMY     KNEE SURGERY       A IV Location/Drains/Wounds Patient Lines/Drains/Airways Status     Active Line/Drains/Airways     Name Placement date Placement time Site Days   Peripheral IV 11/28/23 20 G Left Antecubital 11/28/23  1119  Antecubital  less than 1            Intake/Output Last 24 hours  Intake/Output Summary (Last 24 hours) at 11/28/2023 1827 Last data filed at 11/28/2023  1119 Gross per 24 hour  Intake 250 ml  Output --  Net 250 ml    Labs/Imaging Results for orders placed or performed during the hospital encounter of 11/28/23 (from the past 48 hours)  CBC WITH DIFFERENTIAL     Status: Abnormal   Collection Time: 11/28/23 11:41 AM  Result Value Ref Range   WBC 10.4 4.0 - 10.5 K/uL   RBC 5.10 3.87 - 5.11 MIL/uL   Hemoglobin 13.0 12.0 - 15.0 g/dL   HCT 95.6 21.3 - 08.6 %   MCV 81.6 80.0 - 100.0 fL   MCH 25.5 (L) 26.0 - 34.0 pg   MCHC 31.3 30.0 - 36.0 g/dL   RDW 57.8 46.9 - 62.9 %   Platelets 281 150 - 400 K/uL   nRBC 0.0 0.0 - 0.2 %   Neutrophils Relative % 79 %   Neutro Abs 8.1 (H) 1.7 - 7.7 K/uL   Lymphocytes Relative 13 %   Lymphs Abs 1.4 0.7 - 4.0 K/uL   Monocytes Relative 7 %   Monocytes Absolute 0.7 0.1 - 1.0 K/uL   Eosinophils Relative 1 %   Eosinophils Absolute 0.1 0.0 - 0.5 K/uL   Basophils Relative 0 %   Basophils Absolute 0.0 0.0 - 0.1 K/uL   Immature Granulocytes 0 %   Abs Immature Granulocytes 0.04 0.00 - 0.07 K/uL    Comment: Performed at [facility], [address], [place], [place] [address]  Comprehensive metabolic panel     Status: Abnormal   Collection Time: 11/28/23 11:41 AM  Result Value Ref Range   Sodium 136 135 - 145 mmol/L   Potassium 3.7 3.5 - 5.1 mmol/L   Chloride 102 98 - 111 mmol/L   CO2 23 22 - 32 mmol/L   Glucose, Bld 117 (H) 70 - 99 mg/dL    Comment: Glucose reference range applies only to samples taken after fasting for at least 8 hours.   BUN 13 6 - 20 mg/dL   Creatinine, Ser 1.61 0.44 - 1.00 mg/dL   Calcium 8.9 8.9 - 09.6 mg/dL   Total Protein 7.5 6.5 - 8.1 g/dL   Albumin 3.8 3.5 - 5.0 g/dL   AST 27 15 - 41 U/L   ALT 26 0 - 44 U/L   Alkaline Phosphatase 94 38 - 126 U/L   Total Bilirubin 1.2 (H) <1.2 mg/dL   GFR, Estimated >04 >54 mL/min    Comment: (NOTE) Calculated using the CKD-EPI Creatinine Equation (2021)    Anion gap 11 5 - 15    Comment: Performed at [facility], [address],  [place], [place] [address]  Magnesium     Status: None   Collection Time: 11/28/23 11:41 AM  Result Value Ref Range   Magnesium 1.9 1.7 - 2.4 mg/dL    Comment: Performed at [facility], [address], [place], [place] [address]  Troponin I (High Sensitivity)     Status: None   Collection Time: 11/28/23 11:41 AM  Result Value Ref Range   Troponin I (High Sensitivity) <2 <18 ng/L    Comment: (NOTE) Elevated high sensitivity troponin I (hsTnI) values and significant  changes across serial measurements may suggest ACS but many other  chronic and acute conditions are known to elevate hsTnI results.  Refer to the "Links" section for chest pain algorithms and additional  guidance. Performed at [facility], [address] [place], [place] [address]   CBG monitoring, ED     Status: Abnormal   Collection Time: 11/28/23 12:01 PM  Result Value Ref Range   Glucose-Capillary 102 (H) 70 - 99 mg/dL    Comment: Glucose reference range applies only to samples taken after fasting for at least 8 hours.  Troponin I (High Sensitivity)     Status: None   Collection Time: 11/28/23  1:26 PM  Result Value Ref Range   Troponin I (High Sensitivity) <2 <18 ng/L    Comment: (NOTE) Elevated high sensitivity troponin I (hsTnI) values and significant  changes across serial measurements may suggest ACS but many other  chronic and acute conditions are known to elevate hsTnI results.  Refer to the "Links" section for chest pain algorithms and additional  guidance. Performed at [facility], [address], [place], [place] [address]   CBC     Status: None   Collection Time: 11/28/23  6:19 PM  Result Value Ref Range   WBC 10.1 4.0 - 10.5 K/uL   RBC 5.04 3.87 - 5.11 MIL/uL   Hemoglobin 13.3 12.0 - 15.0 g/dL   HCT 57.8 46.9 - 62.9 %   MCV 81.3 80.0 - 100.0 fL   MCH 26.4 26.0 - 34.0 pg   MCHC 32.4 30.0 - 36.0 g/dL   RDW 52.8 41.3 - 24.4 %   Platelets 305 150 - 400 K/uL   nRBC 0.0 0.0 - 0.2 %    Comment: Performed  at [facility], [address], [place], [place] [address]  CT ANGIO HEAD NECK W WO CM Result Date: 11/28/2023 CLINICAL DATA:  Abnormal right M1 segment by MR angiography, concern for high-grade stenosis. EXAM: CT ANGIOGRAPHY HEAD AND NECK WITH AND WITHOUT CONTRAST TECHNIQUE: Multidetector CT imaging of the head and neck was performed using the standard protocol during bolus administration of intravenous contrast. Multiplanar CT image reconstructions and MIPs were obtained to evaluate the vascular anatomy. Carotid stenosis measurements (when applicable) are obtained utilizing NASCET criteria, using the distal internal carotid diameter as the denominator. RADIATION DOSE REDUCTION: This exam was performed according to the departmental dose-optimization program which includes automated exposure control, adjustment of the mA and/or kV according to patient size and/or use of iterative reconstruction technique. CONTRAST:  75mL OMNIPAQUE IOHEXOL 350 MG/ML SOLN COMPARISON:  CT and MRI studies earlier same day. FINDINGS: CTA NECK FINDINGS Aortic arch: Normal Right carotid system: Common carotid artery widely patent to the bifurcation. Mild soft plaque at the bifurcation but no stenosis. Cervical ICA widely patent. Left carotid system: Left carotid system is normal. Vertebral arteries: Both vertebral artery origins are widely patent. Both vessels are normal through the cervical region to the foramen magnum. Skeleton: Normal Other neck: No mass or lymphadenopathy. Upper chest: Lung apices are clear. Review of the MIP images confirms the above findings CTA HEAD FINDINGS Anterior circulation: Both internal carotid arteries are patent through the skull base and siphon regions. The left anterior and middle cerebral arteries are widely patent. On the right, there is severe stenosis in the M1 segment as suggested by MRI. Patient would be at risk of right MCA occlusion. Posterior circulation: Both vertebral arteries are patent  to the basilar artery. No basilar stenosis. Posterior circulation branch vessels are patent. Venous sinuses: Patent and normal. Anatomic variants: None significant. Review of the MIP images confirms the above findings IMPRESSION: 1. Severe focal stenosis in the M1 segment of the right middle cerebral artery as suggested by MRI. Patient would be at risk of right MCA occlusion. 2. Mild soft plaque at the right carotid bifurcation but no stenosis. 3. No other intracranial stenosis. Electronically Signed   By: Paulina Fusi M.D.   On: 11/28/2023 16:21   MR BRAIN WO CONTRAST Result Date: 11/28/2023 CLINICAL DATA:  Transient ischemic attack (TIA) EXAM: MRI HEAD WITHOUT CONTRAST MRA HEAD WITHOUT CONTRAST TECHNIQUE: Multiplanar, multi-echo pulse sequences of the brain and surrounding structures were acquired without intravenous contrast. Angiographic images of the Circle of Willis were acquired using MRA technique without intravenous contrast. COMPARISON:  See same day CT head FINDINGS: MRI HEAD FINDINGS Brain: Punctate acute infarct in the periatrial white matter on the left (series 6, image 23). No hemorrhage. No hydrocephalus. No extra-axial fluid collection. No mass effect. No mass lesion. Vascular: See below Skull and upper cervical spine: Normal marrow signal. Sinuses/Orbits: No middle ear effusion. Trace right mastoid effusion. Mucosal thickening and air-fluid levels in the bilateral sphenoid and left maxillary sinus. Orbits are unremarkable. Other: None. MRA HEAD FINDINGS Anterior circulation: There is loss of the normal flow signal in the M1 segment of the right MCA (series 1009, image 7). This is worrisome for a site of high-grade stenosis Posterior circulation: The V4 segment of the right vertebral artery is not visualized. Otherwise there is no evidence of aneurysm or vascular malformation. Bilateral PCAs are without evidence of proximal occlusion or significant stenosis. Anatomic variants: None IMPRESSION:  1. Punctate acute infarct in the periatrial white matter on the left. 2. Loss of the normal flow signal in the  M1 segment of the right MCA, worrisome for a site high-grade stenosis. Recommend further evaluation with dedicated CTA head and neck angiogram. 3. Mucosal thickening and air-fluid levels in the bilateral sphenoid and left maxillary sinus, which can be seen in the setting of acute sinusitis. Electronically Signed   By: Lorenza Cambridge M.D.   On: 11/28/2023 15:19   MR ANGIO HEAD WO CONTRAST Result Date: 11/28/2023 CLINICAL DATA:  Transient ischemic attack (TIA) EXAM: MRI HEAD WITHOUT CONTRAST MRA HEAD WITHOUT CONTRAST TECHNIQUE: Multiplanar, multi-echo pulse sequences of the brain and surrounding structures were acquired without intravenous contrast. Angiographic images of the Circle of Willis were acquired using MRA technique without intravenous contrast. COMPARISON:  See same day CT head FINDINGS: MRI HEAD FINDINGS Brain: Punctate acute infarct in the periatrial white matter on the left (series 6, image 23). No hemorrhage. No hydrocephalus. No extra-axial fluid collection. No mass effect. No mass lesion. Vascular: See below Skull and upper cervical spine: Normal marrow signal. Sinuses/Orbits: No middle ear effusion. Trace right mastoid effusion. Mucosal thickening and air-fluid levels in the bilateral sphenoid and left maxillary sinus. Orbits are unremarkable. Other: None. MRA HEAD FINDINGS Anterior circulation: There is loss of the normal flow signal in the M1 segment of the right MCA (series 1009, image 7). This is worrisome for a site of high-grade stenosis Posterior circulation: The V4 segment of the right vertebral artery is not visualized. Otherwise there is no evidence of aneurysm or vascular malformation. Bilateral PCAs are without evidence of proximal occlusion or significant stenosis. Anatomic variants: None IMPRESSION: 1. Punctate acute infarct in the periatrial white matter on the left. 2.  Loss of the normal flow signal in the M1 segment of the right MCA, worrisome for a site high-grade stenosis. Recommend further evaluation with dedicated CTA head and neck angiogram. 3. Mucosal thickening and air-fluid levels in the bilateral sphenoid and left maxillary sinus, which can be seen in the setting of acute sinusitis. Electronically Signed   By: Lorenza Cambridge M.D.   On: 11/28/2023 15:19   DG Chest Port 1 View Result Date: 11/28/2023 CLINICAL DATA:  Syncope.  Hypothyroidism. EXAM: PORTABLE CHEST 1 VIEW COMPARISON:  Radiographs 10/30/2023 and 02/16/2023. FINDINGS: 1153 hours. The heart size and mediastinal contours are normal. The lungs are clear. There is no pleural effusion or pneumothorax. No acute osseous findings are identified. Telemetry leads overlie the chest. IMPRESSION: No evidence of acute cardiopulmonary process. Electronically Signed   By: Carey Bullocks M.D.   On: 11/28/2023 12:38   CT HEAD WO CONTRAST Result Date: 11/28/2023 CLINICAL DATA:  Syncope/presyncope, cerebrovascular cause suspected. EXAM: CT HEAD WITHOUT CONTRAST TECHNIQUE: Contiguous axial images were obtained from the base of the skull through the vertex without intravenous contrast. RADIATION DOSE REDUCTION: This exam was performed according to the departmental dose-optimization program which includes automated exposure control, adjustment of the mA and/or kV according to patient size and/or use of iterative reconstruction technique. COMPARISON:  None Available. FINDINGS: Brain: There is no evidence of an acute infarct, intracranial hemorrhage, mass, midline shift, or extra-axial fluid collection. Cerebral volume is normal with normal size of the ventricles. Vascular: No hyperdense vessel. Skull: No acute fracture or suspicious osseous lesion. Sinuses/Orbits: Mild mucosal thickening and secretions in the sphenoid sinuses. Clear mastoid air cells. Unremarkable included orbits. Other: None. IMPRESSION: No evidence of acute  intracranial abnormality. Electronically Signed   By: Sebastian Ache M.D.   On: 11/28/2023 12:33    Pending Labs Unresulted Labs (From admission, onward)  Start     Ordered   12/05/23 0500  Creatinine, serum  (enoxaparin (LOVENOX)    CrCl >/= 30 ml/min)  Weekly,   R     Comments: while on enoxaparin therapy    11/28/23 1805   11/29/23 0500  Basic metabolic panel  Tomorrow morning,   R        11/28/23 1805   11/29/23 0500  CBC  Tomorrow morning,   R        11/28/23 1805   11/29/23 0500  TSH  Tomorrow morning,   R        11/28/23 1805   11/29/23 0500  Hemoglobin A1c  Tomorrow morning,   R        11/28/23 1805   11/29/23 0500  Lipid panel  Tomorrow morning,   R        11/28/23 1805   11/28/23 1803  HIV Antibody (routine testing w rflx)  (HIV Antibody (Routine testing w reflex) panel)  Once,   R        11/28/23 1805   11/28/23 1803  Creatinine, serum  (enoxaparin (LOVENOX)    CrCl >/= 30 ml/min)  Once,   R       Comments: Baseline for enoxaparin therapy IF NOT ALREADY DRAWN.    11/28/23 1805            Vitals/Pain Today's Vitals   11/28/23 1622 11/28/23 1630 11/28/23 1700 11/28/23 1730  BP:  (!) 153/90 (!) 164/99 (!) 169/99  Pulse:  (!) 106 (!) 106 (!) 104  Resp:  11 14 13   Temp: 98 F (36.7 C)     TempSrc:      SpO2:  98% 100% 99%  Weight:      Height:      PainSc:        Isolation Precautions No active isolations  Medications Medications  ALPRAZolam (XANAX) tablet 0.5 mg (has no administration in time range)  levothyroxine (SYNTHROID) tablet 88 mcg (has no administration in time range)  acetaminophen (TYLENOL) tablet 650 mg (has no administration in time range)    Or  acetaminophen (TYLENOL) suppository 650 mg (has no administration in time range)  polyethylene glycol (MIRALAX / GLYCOLAX) packet 17 g (17 g Oral Patient Refused/Not Given 11/28/23 1824)  bisacodyl (DULCOLAX) EC tablet 5 mg (has no administration in time range)  enoxaparin (LOVENOX) injection  50 mg (has no administration in time range)  clopidogrel (PLAVIX) tablet 75 mg (75 mg Oral Given 11/28/23 1821)  aspirin EC tablet 81 mg (81 mg Oral Given 11/28/23 1821)  lactated ringers bolus 1,000 mL (0 mLs Intravenous Stopped 11/28/23 1505)  ondansetron (ZOFRAN) injection 4 mg (4 mg Intravenous Given 11/28/23 1203)  iohexol (OMNIPAQUE) 350 MG/ML injection 75 mL (75 mLs Intravenous Contrast Given 11/28/23 1543)  ondansetron (ZOFRAN) injection 4 mg (4 mg Intravenous Given 11/28/23 1633)    Mobility walks     Focused Assessments Neuro Assessment Handoff:  Swallow screen pass? Yes  Cardiac Rhythm: Normal sinus rhythm NIH Stroke Scale  Dizziness Present: No Headache Present: No Interval: Initial Level of Consciousness (1a.)   : Alert, keenly responsive LOC Questions (1b. )   : Answers both questions correctly LOC Commands (1c. )   : Performs both tasks correctly Best Gaze (2. )  : Normal Visual (3. )  : No visual loss Facial Palsy (4. )    : Normal symmetrical movements Motor Arm, Left (5a. )   : No drift Motor  Arm, Right (5b. ) : No drift Motor Leg, Left (6a. )  : No drift Motor Leg, Right (6b. ) : No drift Limb Ataxia (7. ): Absent Sensory (8. )  : Normal, no sensory loss Best Language (9. )  : No aphasia Dysarthria (10. ): Normal Extinction/Inattention (11.)   : No Abnormality Complete NIHSS TOTAL: 0     Neuro Assessment:   Neuro Checks:   Initial (11/28/23 1630)  Has TPA been given? No If patient is a Neuro Trauma and patient is going to OR before floor call report to 4N Charge nurse: [phone] or [phone]   R Recommendations: See Admitting Provider Note  Report given to:   Additional Notes:

## 2023-11-28 NOTE — ED Provider Notes (Signed)
[place] EMERGENCY DEPARTMENT AT [facility] Provider Note   CSN: [id number] Arrival date & time: 11/28/23  1115     History  Chief Complaint  Patient presents with   Loss of Consciousness    [name] is a 57 y.o. female.  HPI Patient presents for syncope.  Medical history includes GERD, prediabetes, IBS, anemia, migraine headaches, gastritis, hypothyroidism.  This morning, she was in her normal state of health.  She was recently diagnosed with a hiatal hernia.  She does take daily Protonix.  She takes Carafate as needed.  She has some epigastric pain this morning and did take some Carafate.  Approximately 1 hour later, she was sitting at her computer at work.  She experienced some visual disturbance described as some zigzag lines in her visual field.  She felt generalized weakness and some diaphoresis.  She went to tell her boss that she was going home early because she was not feeling well.  As she was walking back to her office, patient had a syncopal episode.  She fell forward and scraped her knees.  She has been ambulatory since that fall.  She does not think she sustained any injuries.  She has no current areas of discomfort.  After the syncopal episode, patient has had ongoing generalized weakness.  She had some mild nausea which resolved.  She denies any prior history of syncope.    Home Medications Prior to Admission medications   Medication Sig Start Date End Date Taking? Authorizing Provider  ALPRAZolam Prudy Feeler) 0.5 MG tablet Take 0.5 mg by mouth at bedtime as needed for anxiety.    [provider]  famotidine (PEPCID) 20 MG tablet Take 1 tablet (20 mg total) by mouth 2 (two) times daily. Patient taking differently: Take 20 mg by mouth as needed. 02/16/23   Gerhard Munch, MD  levothyroxine (SYNTHROID) 88 MCG tablet Take 1 tablet (88 mcg total) by mouth daily before breakfast. 05/27/23   Tommie Sams, DO  pantoprazole (PROTONIX) 40 MG tablet Take 1  tablet (40 mg total) by mouth daily. 06/02/23   Zehr, Princella Pellegrini, PA-C  predniSONE (DELTASONE) 20 MG tablet Take 2 tablets (40 mg total) by mouth daily with breakfast. 10/30/23   Particia Nearing, PA-C  promethazine-dextromethorphan (PROMETHAZINE-DM) 6.25-15 MG/5ML syrup Take 5 mLs by mouth 4 (four) times daily as needed. 10/30/23   Particia Nearing, PA-C  sucralfate (CARAFATE) 1 g tablet Take 1 tablet (1 g total) by mouth 4 (four) times daily -  with meals and at bedtime. Patient taking differently: Take 1 g by mouth as needed. 02/16/23   Gerhard Munch, MD      Allergies    Erythromycin and Amoxil [amoxicillin]    Review of Systems   Review of Systems  Constitutional:  Positive for fatigue.  Eyes:  Positive for visual disturbance.  Gastrointestinal:  Positive for nausea.  Neurological:  Positive for syncope and weakness (Generalized).  All other systems reviewed and are negative.   Physical Exam Updated Vital Signs BP (!) 165/90   Pulse (!) 102   Temp 98 F (36.7 C) (Oral)   Resp (!) 28   Ht 5\' 5"  (1.651 m)   Wt 99.8 kg   LMP 01/11/2016   SpO2 100%   BMI 36.61 kg/m  Physical Exam Vitals and nursing note reviewed.  Constitutional:      General: She is not in acute distress.    Appearance: Normal appearance. She is well-developed. She is  not ill-appearing, toxic-appearing or diaphoretic.  HENT:     Head: Normocephalic and atraumatic.     Right Ear: External ear normal.     Left Ear: External ear normal.     Nose: Nose normal.     Mouth/Throat:     Mouth: Mucous membranes are moist.  Eyes:     General: No visual field deficit.    Extraocular Movements: Extraocular movements intact.     Conjunctiva/sclera: Conjunctivae normal.  Cardiovascular:     Rate and Rhythm: Normal rate and regular rhythm.     Heart sounds: No murmur heard. Pulmonary:     Effort: Pulmonary effort is normal. No respiratory distress.     Breath sounds: Normal breath sounds. No wheezing  or rales.  Chest:     Chest wall: No tenderness.  Abdominal:     General: There is no distension.     Palpations: Abdomen is soft.     Tenderness: There is no abdominal tenderness.  Musculoskeletal:        General: No swelling, deformity or signs of injury. Normal range of motion.     Cervical back: Normal range of motion and neck supple.     Right lower leg: No edema.     Left lower leg: No edema.  Skin:    General: Skin is warm and dry.     Coloration: Skin is not jaundiced or pale.  Neurological:     General: No focal deficit present.     Mental Status: She is alert and oriented to person, place, and time.     Cranial Nerves: No cranial nerve deficit, dysarthria or facial asymmetry.     Sensory: Sensation is intact. No sensory deficit.     Motor: No weakness, abnormal muscle tone or pronator drift.     Coordination: Coordination is intact. Coordination normal. Finger-Nose-Finger Test normal.  Psychiatric:        Mood and Affect: Mood normal.        Behavior: Behavior normal.     ED Results / Procedures / Treatments   Labs (all labs ordered are listed, but only abnormal results are displayed) Labs Reviewed  CBC WITH DIFFERENTIAL/PLATELET - Abnormal; Notable for the following components:      Result Value   MCH 25.5 (*)    Neutro Abs 8.1 (*)    All other components within normal limits  COMPREHENSIVE METABOLIC PANEL - Abnormal; Notable for the following components:   Glucose, Bld 117 (*)    Total Bilirubin 1.2 (*)    All other components within normal limits  CBG MONITORING, ED - Abnormal; Notable for the following components:   Glucose-Capillary 102 (*)    All other components within normal limits  MAGNESIUM  TROPONIN I (HIGH SENSITIVITY)  TROPONIN I (HIGH SENSITIVITY)    EKG EKG Interpretation Date/Time:  Monday November 28 2023 11:29:31 EST Ventricular Rate:  102 PR Interval:  187 QRS Duration:  87 QT Interval:  376 QTC Calculation: 490 R  Axis:   10  Text Interpretation: Sinus tachycardia Low voltage, precordial leads Borderline T abnormalities, anterior leads Borderline prolonged QT interval Confirmed by Gloris Manchester ([id number]) on 11/28/2023 11:32:29 AM  Radiology MR BRAIN WO CONTRAST Result Date: 11/28/2023 CLINICAL DATA:  Transient ischemic attack (TIA) EXAM: MRI HEAD WITHOUT CONTRAST MRA HEAD WITHOUT CONTRAST TECHNIQUE: Multiplanar, multi-echo pulse sequences of the brain and surrounding structures were acquired without intravenous contrast. Angiographic images of the Circle of Willis were acquired using MRA technique without  intravenous contrast. COMPARISON:  See same day CT head FINDINGS: MRI HEAD FINDINGS Brain: Punctate acute infarct in the periatrial white matter on the left (series 6, image 23). No hemorrhage. No hydrocephalus. No extra-axial fluid collection. No mass effect. No mass lesion. Vascular: See below Skull and upper cervical spine: Normal marrow signal. Sinuses/Orbits: No middle ear effusion. Trace right mastoid effusion. Mucosal thickening and air-fluid levels in the bilateral sphenoid and left maxillary sinus. Orbits are unremarkable. Other: None. MRA HEAD FINDINGS Anterior circulation: There is loss of the normal flow signal in the M1 segment of the right MCA (series 1009, image 7). This is worrisome for a site of high-grade stenosis Posterior circulation: The V4 segment of the right vertebral artery is not visualized. Otherwise there is no evidence of aneurysm or vascular malformation. Bilateral PCAs are without evidence of proximal occlusion or significant stenosis. Anatomic variants: None IMPRESSION: 1. Punctate acute infarct in the periatrial white matter on the left. 2. Loss of the normal flow signal in the M1 segment of the right MCA, worrisome for a site high-grade stenosis. Recommend further evaluation with dedicated CTA head and neck angiogram. 3. Mucosal thickening and air-fluid levels in the bilateral sphenoid and  left maxillary sinus, which can be seen in the setting of acute sinusitis. Electronically Signed   By: Lorenza Cambridge M.D.   On: 11/28/2023 15:19   MR ANGIO HEAD WO CONTRAST Result Date: 11/28/2023 CLINICAL DATA:  Transient ischemic attack (TIA) EXAM: MRI HEAD WITHOUT CONTRAST MRA HEAD WITHOUT CONTRAST TECHNIQUE: Multiplanar, multi-echo pulse sequences of the brain and surrounding structures were acquired without intravenous contrast. Angiographic images of the Circle of Willis were acquired using MRA technique without intravenous contrast. COMPARISON:  See same day CT head FINDINGS: MRI HEAD FINDINGS Brain: Punctate acute infarct in the periatrial white matter on the left (series 6, image 23). No hemorrhage. No hydrocephalus. No extra-axial fluid collection. No mass effect. No mass lesion. Vascular: See below Skull and upper cervical spine: Normal marrow signal. Sinuses/Orbits: No middle ear effusion. Trace right mastoid effusion. Mucosal thickening and air-fluid levels in the bilateral sphenoid and left maxillary sinus. Orbits are unremarkable. Other: None. MRA HEAD FINDINGS Anterior circulation: There is loss of the normal flow signal in the M1 segment of the right MCA (series 1009, image 7). This is worrisome for a site of high-grade stenosis Posterior circulation: The V4 segment of the right vertebral artery is not visualized. Otherwise there is no evidence of aneurysm or vascular malformation. Bilateral PCAs are without evidence of proximal occlusion or significant stenosis. Anatomic variants: None IMPRESSION: 1. Punctate acute infarct in the periatrial white matter on the left. 2. Loss of the normal flow signal in the M1 segment of the right MCA, worrisome for a site high-grade stenosis. Recommend further evaluation with dedicated CTA head and neck angiogram. 3. Mucosal thickening and air-fluid levels in the bilateral sphenoid and left maxillary sinus, which can be seen in the setting of acute sinusitis.  Electronically Signed   By: Lorenza Cambridge M.D.   On: 11/28/2023 15:19   DG Chest Port 1 View Result Date: 11/28/2023 CLINICAL DATA:  Syncope.  Hypothyroidism. EXAM: PORTABLE CHEST 1 VIEW COMPARISON:  Radiographs 10/30/2023 and 02/16/2023. FINDINGS: 1153 hours. The heart size and mediastinal contours are normal. The lungs are clear. There is no pleural effusion or pneumothorax. No acute osseous findings are identified. Telemetry leads overlie the chest. IMPRESSION: No evidence of acute cardiopulmonary process. Electronically Signed   By: Carey Bullocks  M.D.   On: 11/28/2023 12:38   CT HEAD WO CONTRAST Result Date: 11/28/2023 CLINICAL DATA:  Syncope/presyncope, cerebrovascular cause suspected. EXAM: CT HEAD WITHOUT CONTRAST TECHNIQUE: Contiguous axial images were obtained from the base of the skull through the vertex without intravenous contrast. RADIATION DOSE REDUCTION: This exam was performed according to the departmental dose-optimization program which includes automated exposure control, adjustment of the mA and/or kV according to patient size and/or use of iterative reconstruction technique. COMPARISON:  None Available. FINDINGS: Brain: There is no evidence of an acute infarct, intracranial hemorrhage, mass, midline shift, or extra-axial fluid collection. Cerebral volume is normal with normal size of the ventricles. Vascular: No hyperdense vessel. Skull: No acute fracture or suspicious osseous lesion. Sinuses/Orbits: Mild mucosal thickening and secretions in the sphenoid sinuses. Clear mastoid air cells. Unremarkable included orbits. Other: None. IMPRESSION: No evidence of acute intracranial abnormality. Electronically Signed   By: Sebastian Ache M.D.   On: 11/28/2023 12:33    Procedures Procedures    Medications Ordered in ED Medications  lactated ringers bolus 1,000 mL (0 mLs Intravenous Stopped 11/28/23 1505)  ondansetron (ZOFRAN) injection 4 mg (4 mg Intravenous Given 11/28/23 1203)   iohexol (OMNIPAQUE) 350 MG/ML injection 75 mL (75 mLs Intravenous Contrast Given 11/28/23 1543)    ED Course/ Medical Decision Making/ A&P                                 Medical Decision Making Amount and/or Complexity of Data Reviewed Labs: ordered. Radiology: ordered.  Risk Prescription drug management.   This patient presents to the ED for concern of syncope, this involves an extensive number of treatment options, and is a complaint that carries with it a high risk of complications and morbidity.  The differential diagnosis includes vasovagal episode, arrhythmia, dehydration, anemia, metabolic derangements, TIA, CVA, seizure   Co morbidities that complicate the patient evaluation  GERD, prediabetes, IBS, anemia, migraine headaches, gastritis, hypothyroidism   Additional history obtained:  Additional history obtained from N/A External records from outside source obtained and reviewed including EMR   Lab Tests:  I Ordered, and personally interpreted labs.  The pertinent results include: Normal hemoglobin, no leukocytosis, normal kidney function, normal electrolytes, normal troponin   Imaging Studies ordered:  I ordered imaging studies including chest x-ray, CT head, MRI brain, MRA head, CTA head and neck I independently visualized and interpreted imaging which showed acute infarct was identified in periatrial white matter on the left on MRI.  There is also concern of high-grade MCA stenosis.  CTA was pending at time of signout. I agree with the radiologist interpretation   Cardiac Monitoring: / EKG:  The patient was maintained on a cardiac monitor.  I personally viewed and interpreted the cardiac monitored which showed an underlying rhythm of: Sinus rhythm   Consultations Obtained:  I requested consultation with the neurologist, Dr. Iver Nestle,  and discussed lab and imaging findings as well as pertinent plan - they recommend: Admission to [facility] or [facility],  depending on CTA results   Problem List / ED Course / Critical interventions / Medication management  Patient presenting after syncopal episode.  Prodrome included visual disturbance, diaphoresis, generalized weakness.  Although she did have a syncope and collapse from standing, she does not suspect any injuries.  She has no areas of significant tenderness or deformity.  She has no focal neurologic deficits on exam.  Other than some fatigue and  generalized weakness, patient is currently asymptomatic.  Patient was placed on bedside cardiac monitor.  Workup was initiated.  Patient's lab work was unremarkable.  CT of head did not show acute findings.  On reassessment, patient endorsed continued vague complaints of generalized weakness.  MRI was ordered to assess for possible TIA.  MRI actually did show what appears to be an acute punctate infarct in periatrial white matter on the left.  There was also concern of high-grade MCA stenosis.  Radiology recommends CTA to further evaluate.  This was ordered.  I discussed with neurologist on-call, Dr. Iver Nestle, who also advises CTA.  Patient to be admitted.  Location of admission depending on CTA results.  Patient continued to have no focal neurologic complaints while in the ED.  Care of patient was signed out to oncoming ED provider. I ordered medication including IV fluids for hydration; Zofran for nausea Reevaluation of the patient after these medicines showed that the patient improved I have reviewed the patients home medicines and have made adjustments as needed   Social Determinants of Health:  Has PCP        Final Clinical Impression(s) / ED Diagnoses Final diagnoses:  Cerebrovascular accident (CVA), unspecified mechanism (HCC)  Syncope and collapse    Rx / DC Orders ED Discharge Orders     None         Gloris Manchester, MD 11/28/23 1612

## 2023-11-28 NOTE — Progress Notes (Signed)
PHARMACIST - PHYSICIAN COMMUNICATION  CONCERNING:  Enoxaparin (Lovenox) for DVT Prophylaxis   RECOMMENDATION: Patient was prescribed enoxaprin 40mg  q24 hours for VTE prophylaxis.   Filed Weights   11/28/23 1120  Weight: 99.8 kg (220 lb)    Body mass index is 36.61 kg/m.  Estimated Creatinine Clearance: 80.8 mL/min (by C-G formula based on SCr of 0.91 mg/dL).  Based on [facility] policy patient is candidate for enoxaparin 0.5mg /kg TBW SQ every 24 hours based on BMI being >30.  DESCRIPTION: Pharmacy has adjusted enoxaparin dose per [facility] policy.  Patient is now receiving enoxaparin 50 mg every 24 hours   Tressie Ellis 11/28/2023 6:17 PM

## 2023-11-28 NOTE — ED Triage Notes (Signed)
Per EMS  [employer]

## 2023-11-28 NOTE — Plan of Care (Signed)
Per ED provider, patient presented with syncope preceded by zigzags in her bilateral vision.  No prior history of migraine aura or migraine headaches.  Some stroke risk factors (obstructive sleep apnea, BMI 36.61, prediabetes, hepatic steatosis)  MRI brain revealed punctate acute infarct in the left hemisphere but a severe right MCA stenosis which was confirmed on CTA  However patient is denying any left-sided symptoms per ED provider; additionally she was hypertensive on arrival  Based on these data severe stenosis does not seem to be symptomatic at this time.  Recommend admission to [facility], standard stroke workup, place on aspirin and Plavix if no contraindication (81 mg daily and 75 mg daily respectively, course TBD on full eval), permissive hypertension overnight, full consultation with Dr. Melynda Ripple tomorrow  Brooke Dare MD-PhD [facility] [phone]  These are curbside recommendations based upon brief review of the information readily available in the chart on brief review as well as history and examination information provided to me by requesting provider and do not replace a full detailed consult

## 2023-11-29 ENCOUNTER — Other Ambulatory Visit: Payer: Self-pay

## 2023-11-29 ENCOUNTER — Other Ambulatory Visit (HOSPITAL_COMMUNITY): Payer: Self-pay | Admitting: *Deleted

## 2023-11-29 ENCOUNTER — Inpatient Hospital Stay (HOSPITAL_COMMUNITY): Payer: 59

## 2023-11-29 DIAGNOSIS — I669 Occlusion and stenosis of unspecified cerebral artery: Secondary | ICD-10-CM | POA: Diagnosis present

## 2023-11-29 DIAGNOSIS — I6609 Occlusion and stenosis of unspecified middle cerebral artery: Secondary | ICD-10-CM | POA: Diagnosis present

## 2023-11-29 DIAGNOSIS — I639 Cerebral infarction, unspecified: Secondary | ICD-10-CM

## 2023-11-29 DIAGNOSIS — I6389 Other cerebral infarction: Secondary | ICD-10-CM

## 2023-11-29 DIAGNOSIS — E669 Obesity, unspecified: Secondary | ICD-10-CM | POA: Diagnosis present

## 2023-11-29 LAB — CBC
HCT: 40.2 % (ref 36.0–46.0)
Hemoglobin: 13 g/dL (ref 12.0–15.0)
MCH: 26.3 pg (ref 26.0–34.0)
MCHC: 32.3 g/dL (ref 30.0–36.0)
MCV: 81.2 fL (ref 80.0–100.0)
Platelets: 321 10*3/uL (ref 150–400)
RBC: 4.95 MIL/uL (ref 3.87–5.11)
RDW: 14.2 % (ref 11.5–15.5)
WBC: 8.7 10*3/uL (ref 4.0–10.5)
nRBC: 0 % (ref 0.0–0.2)

## 2023-11-29 LAB — ECHOCARDIOGRAM COMPLETE
AR max vel: 2.17 cm2
AV Area VTI: 2.34 cm2
AV Area mean vel: 2.12 cm2
AV Mean grad: 8 mm[Hg]
AV Peak grad: 15.4 mm[Hg]
Ao pk vel: 1.96 m/s
Area-P 1/2: 6.32 cm2
Est EF: >75
Height: 65 in
S' Lateral: 2.3 cm
Weight: 3569.69 [oz_av]

## 2023-11-29 LAB — BASIC METABOLIC PANEL
Anion gap: 10 (ref 5–15)
BUN: 12 mg/dL (ref 6–20)
CO2: 26 mmol/L (ref 22–32)
Calcium: 9.4 mg/dL (ref 8.9–10.3)
Chloride: 103 mmol/L (ref 98–111)
Creatinine, Ser: 0.92 mg/dL (ref 0.44–1.00)
GFR, Estimated: >60 mL/min (ref >60)
Glucose, Bld: 103 mg/dL — ABNORMAL HIGH (ref 70–99)
Potassium: 3.6 mmol/L (ref 3.5–5.1)
Sodium: 139 mmol/L (ref 135–145)

## 2023-11-29 LAB — LIPID PANEL
Cholesterol: 245 mg/dL — ABNORMAL HIGH (ref 0–200)
HDL: 60 mg/dL (ref >40)
LDL Cholesterol: 158 mg/dL — ABNORMAL HIGH (ref 0–99)
Total CHOL/HDL Ratio: 4.1 {ratio}
Triglycerides: 137 mg/dL (ref <150)
VLDL: 27 mg/dL (ref 0–40)

## 2023-11-29 LAB — TSH: TSH: 8.602 u[IU]/mL — ABNORMAL HIGH (ref 0.350–4.500)

## 2023-11-29 LAB — HEMOGLOBIN A1C
Hgb A1c MFr Bld: 5.3 % (ref 4.8–5.6)
Mean Plasma Glucose: 105.41 mg/dL

## 2023-11-29 MED ORDER — CLOPIDOGREL BISULFATE 75 MG PO TABS: 75.0000 mg | ORAL_TABLET | Freq: Every day | ORAL | 0 refills | Status: DC

## 2023-11-29 MED ORDER — ASPIRIN 81 MG PO TBEC: 81.0000 mg | DELAYED_RELEASE_TABLET | Freq: Every day | ORAL | 3 refills | Status: AC

## 2023-11-29 MED ORDER — STROKE: EARLY STAGES OF RECOVERY BOOK
Freq: Once | Status: AC
  Administered 2023-11-29: 1

## 2023-11-29 MED ORDER — PERFLUTREN LIPID MICROSPHERE
1.0000 mL | INTRAVENOUS | Status: AC | PRN
  Administered 2023-11-29: 4 mL via INTRAVENOUS

## 2023-11-29 MED ORDER — ACETAMINOPHEN 325 MG PO TABS: 650.0000 mg | ORAL_TABLET | Freq: Four times a day (QID) | ORAL | Status: AC | PRN

## 2023-11-29 MED ORDER — ATORVASTATIN CALCIUM 40 MG PO TABS
40.0000 mg | ORAL_TABLET | Freq: Every day | ORAL | Status: DC
  Administered 2023-11-29: 40 mg via ORAL
  Filled 2023-11-29: qty 1

## 2023-11-29 MED ORDER — ATORVASTATIN CALCIUM 40 MG PO TABS: 40.0000 mg | ORAL_TABLET | Freq: Every day | ORAL | 11 refills | Status: DC

## 2023-11-29 NOTE — Discharge Instructions (Signed)
1)Please take Aspirin 81 mg daily along with Plavix 75 mg daily for 21 days then after that STOP the Plavix  and continue ONLY Aspirin 81 mg daily indefinitely--for secondary stroke Prevention  2)Please take Atorvastatin/Lipitor--- which is a cholesterol medicine to lower your cholesterol and for stroke prevention  3)Avoid ibuprofen/Advil/Aleve/Motrin/Goody Powders/Naproxen/BC powders/Meloxicam/Diclofenac/Indomethacin and other Nonsteroidal anti-inflammatory medications as these will make you more likely to bleed and can cause stomach ulcers, can also cause Kidney problems.   4)-Low calorie diet, portion control and increase physical activity  advised -Body mass index is 37.13 kg/m.  5) outpatient follow-up with neurologist Dr. Pearlean Brownie in about 3 months advised

## 2023-11-29 NOTE — Plan of Care (Signed)
Rested well during the night. NIH score remains 0. Problem: Education: Goal: Knowledge of General Education information will improve Description: Including pain rating scale, medication(s)/side effects and non-pharmacologic comfort measures 11/29/2023 0340 by Ena Dawley, RN Outcome: Progressing 11/29/2023 0339 by Ena Dawley, RN Outcome: Progressing   Problem: Health Behavior/Discharge Planning: Goal: Ability to manage health-related needs will improve 11/29/2023 0340 by Ena Dawley, RN Outcome: Progressing 11/29/2023 0339 by Ena Dawley, RN Outcome: Progressing   Problem: Clinical Measurements: Goal: Ability to maintain clinical measurements within normal limits will improve 11/29/2023 0340 by Ena Dawley, RN Outcome: Progressing 11/29/2023 0339 by Ena Dawley, RN Outcome: Progressing Goal: Will remain free from infection 11/29/2023 0340 by Ena Dawley, RN Outcome: Progressing 11/29/2023 0339 by Ena Dawley, RN Outcome: Progressing Goal: Diagnostic test results will improve 11/29/2023 0340 by Ena Dawley, RN Outcome: Progressing 11/29/2023 0339 by Ena Dawley, RN Outcome: Progressing Goal: Respiratory complications will improve 11/29/2023 0340 by Ena Dawley, RN Outcome: Progressing 11/29/2023 0339 by Ena Dawley, RN Outcome: Progressing Goal: Cardiovascular complication will be avoided 11/29/2023 0340 by Ena Dawley, RN Outcome: Progressing 11/29/2023 0339 by Ena Dawley, RN Outcome: Progressing   Problem: Activity: Goal: Risk for activity intolerance will decrease 11/29/2023 0340 by Ena Dawley, RN Outcome: Progressing 11/29/2023 0339 by Ena Dawley, RN Outcome: Progressing   Problem: Nutrition: Goal: Adequate nutrition will be maintained 11/29/2023 0340 by Ena Dawley, RN Outcome: Progressing 11/29/2023 0339 by Ena Dawley, RN Outcome: Progressing   Problem:  Coping: Goal: Level of anxiety will decrease 11/29/2023 0340 by Ena Dawley, RN Outcome: Progressing 11/29/2023 0339 by Ena Dawley, RN Outcome: Progressing   Problem: Elimination: Goal: Will not experience complications related to bowel motility 11/29/2023 0340 by Ena Dawley, RN Outcome: Progressing 11/29/2023 0339 by Ena Dawley, RN Outcome: Progressing Goal: Will not experience complications related to urinary retention 11/29/2023 0340 by Ena Dawley, RN Outcome: Progressing 11/29/2023 0339 by Ena Dawley, RN Outcome: Progressing   Problem: Pain Management: Goal: General experience of comfort will improve 11/29/2023 0340 by Ena Dawley, RN Outcome: Progressing 11/29/2023 0339 by Ena Dawley, RN Outcome: Progressing   Problem: Safety: Goal: Ability to remain free from injury will improve 11/29/2023 0340 by Ena Dawley, RN Outcome: Progressing 11/29/2023 0339 by Ena Dawley, RN Outcome: Progressing   Problem: Skin Integrity: Goal: Risk for impaired skin integrity will decrease 11/29/2023 0340 by Ena Dawley, RN Outcome: Progressing 11/29/2023 0339 by Ena Dawley, RN Outcome: Progressing   Problem: Education: Goal: Knowledge of disease or condition will improve Outcome: Progressing Goal: Knowledge of secondary prevention will improve (MUST DOCUMENT ALL) Outcome: Progressing Goal: Knowledge of patient specific risk factors will improve Loraine Leriche N/A or DELETE if not current risk factor) Outcome: Progressing

## 2023-11-29 NOTE — Consult Note (Signed)
I connected with  [name] on 11/29/23 by a video enabled telemedicine application and verified that I am speaking with the correct person using two identifiers.   I discussed the limitations of evaluation and management by telemedicine. The patient expressed understanding and agreed to proceed.  Location of patient:[facility] Location of physician: [facility]   Neurology Consultation Reason for Consult: stroke Referring Physician: Dr Gloris Manchester  CC: syncope  History is obtained from: Patient, chart review  HPI: [name] is a 57 y.o. female with past medical history of hypothyroidism, mild obstructive sleep apnea who presented after an episode of syncope.  Patient states she was at baseline in the morning and went to work.  While at work reports feeling nauseous, lightheaded.  She stood up and noticed little black spots in her vision followed by loss of consciousness for few seconds.  Quickly returned to baseline.  Denies any similar episodes in the past.  Denies any recent medication changes, palpitations, recent illness.  ROS: All other systems reviewed and negative except as noted in the HPI.  Past Medical History:  Diagnosis Date   GERD (gastroesophageal reflux disease)    Hypothyroidism    IBS (irritable bowel syndrome)    S/P colonoscopy 07/13/2010   [place]: no results available. Reportedly done because heme +    Family History  Problem Relation Age of Onset   Diabetes Father    Colon cancer Neg Hx    Esophageal cancer Neg Hx    Rectal cancer Neg Hx    Stomach cancer Neg Hx     Social History:  reports that she has never smoked. She has never used smokeless tobacco. She reports that she does not drink alcohol and does not use drugs.   Medications Prior to Admission  Medication Sig Dispense Refill Last Dose/Taking   ALPRAZolam (XANAX) 0.5 MG tablet Take 0.5 mg by mouth at bedtime as needed for anxiety.   Past Week   famotidine (PEPCID) 20 MG tablet  Take 1 tablet (20 mg total) by mouth 2 (two) times daily. (Patient taking differently: Take 20 mg by mouth as needed.) 30 tablet 0 Past Week   levothyroxine (SYNTHROID) 88 MCG tablet Take 1 tablet (88 mcg total) by mouth daily before breakfast. 90 tablet 2 11/28/2023   pantoprazole (PROTONIX) 40 MG tablet Take 1 tablet (40 mg total) by mouth daily. 90 tablet 3 11/27/2023   sucralfate (CARAFATE) 1 g tablet Take 1 tablet (1 g total) by mouth 4 (four) times daily -  with meals and at bedtime. (Patient taking differently: Take 1 g by mouth as needed.) 21 tablet 0 11/28/2023      Exam: Current vital signs: BP (!) 167/82 (BP Location: Right Arm)   Pulse 98   Temp 98.7 F (37.1 C) (Oral)   Resp 20   Ht 5\' 5"  (1.651 m)   Wt 101.2 kg   LMP 01/11/2016   SpO2 98%   BMI 37.13 kg/m  Vital signs in last 24 hours: Temp:  [98 F (36.7 C)-99 F (37.2 C)] 98.7 F (37.1 C) (12/17 0725) Pulse Rate:  [95-108] 98 (12/17 0725) Resp:  [11-28] 20 (12/17 0725) BP: (153-179)/(82-103) 167/82 (12/17 0725) SpO2:  [97 %-100 %] 98 % (12/17 0725) Weight:  [99.8 kg-101.2 kg] 101.2 kg (12/16 2002)   Physical Exam  Constitutional: Appears well-developed and well-nourished.  Psych: Affect appropriate to situation Neuro: AOx3, cranial nerves II to XII grossly intact, antigravity strength in upper extremities  without drift, sensory intact to light touch, FTN intact bilaterally   I have reviewed labs in epic and the results pertinent to this consultation are: CBC:  Recent Labs  Lab 11/28/23 1141 11/28/23 1819 11/29/23 0428  WBC 10.4 10.1 8.7  NEUTROABS 8.1*  --   --   HGB 13.0 13.3 13.0  HCT 41.6 41.0 40.2  MCV 81.6 81.3 81.2  PLT 281 305 321    Basic Metabolic Panel:  Lab Results  Component Value Date   NA 139 11/29/2023   K 3.6 11/29/2023   CO2 26 11/29/2023   GLUCOSE 103 (H) 11/29/2023   BUN 12 11/29/2023   CREATININE 0.92 11/29/2023   CALCIUM 9.4 11/29/2023   GFRNONAA >60 11/29/2023    GFRAA 61 01/04/2020   Lipid Panel:  Lab Results  Component Value Date   LDLCALC 158 (H) 11/29/2023   HgbA1c:  Lab Results  Component Value Date   HGBA1C 5.7 (H) 04/26/2023   Urine Drug Screen: No results found for: "LABOPIA", "COCAINSCRNUR", "LABBENZ", "AMPHETMU", "THCU", "LABBARB"  Alcohol Level No results found for: "ETH"   I have reviewed the images obtained:  CT Head without contrast 11/28/2023: No evidence of acute intracranial abnormality.   CT angio Head and Neck with contrast 11/29/2023: Severe focal stenosis in the M1 segment of the right middle cerebral artery as suggested by MRI. Patient would be at risk of right MCA occlusion. Mild soft plaque at the right carotid bifurcation but no stenosis. No other intracranial stenosis.  MRI Brain and MRA head wo contrast 11/28/2023:  Punctate acute infarct in the periatrial white matter on the left. Loss of the normal flow signal in the M1 segment of the right MCA, worrisome for a site high-grade stenosis.  ASSESSMENT/PLAN: 57 year old female who presented after an episode of syncope.  MRI brain showed acute ischemic stroke.  Therefore neurology was consulted.  Acute ischemic stroke (incidental) Right MCA stenosis Hyperlipidemia Sleep apnea -Etiology: Likely small vessel disease  Recommendations: -Discussed with Dr Pearlean Brownie. Recommend aspirin 81 mg daily and Plavix 75 mg daily for 3 weeks followed by monotherapy with aspirin 81 mg daily -Recommend atorvastatin 40 mg daily -If TTE within normal limits, recommend 30-day cardiac monitor -Goal blood pressure: Normotension -Discussed stroke risk factors as well as importance of managing comorbidities -Follow-up with neurology in 3 months (order placed) at -Stroke education   Thank you for allowing [place] to participate in the care of this patient. If you have any further questions, please contact  me or neurohospitalist.   Lindie Spruce [facility]ist

## 2023-11-29 NOTE — Plan of Care (Signed)
  Problem: Education: Goal: Knowledge of General Education information will improve Description: Including pain rating scale, medication(s)/side effects and non-pharmacologic comfort measures Outcome: Adequate for Discharge   Problem: Health Behavior/Discharge Planning: Goal: Ability to manage health-related needs will improve Outcome: Adequate for Discharge   Problem: Clinical Measurements: Goal: Ability to maintain clinical measurements within normal limits will improve Outcome: Adequate for Discharge Goal: Will remain free from infection Outcome: Adequate for Discharge Goal: Diagnostic test results will improve Outcome: Adequate for Discharge Goal: Respiratory complications will improve Outcome: Adequate for Discharge Goal: Cardiovascular complication will be avoided Outcome: Adequate for Discharge   Problem: Activity: Goal: Risk for activity intolerance will decrease Outcome: Adequate for Discharge   Problem: Nutrition: Goal: Adequate nutrition will be maintained Outcome: Adequate for Discharge   Problem: Coping: Goal: Level of anxiety will decrease Outcome: Adequate for Discharge   Problem: Elimination: Goal: Will not experience complications related to bowel motility Outcome: Adequate for Discharge Goal: Will not experience complications related to urinary retention Outcome: Adequate for Discharge   Problem: Pain Management: Goal: General experience of comfort will improve Outcome: Adequate for Discharge   Problem: Safety: Goal: Ability to remain free from injury will improve Outcome: Adequate for Discharge   Problem: Skin Integrity: Goal: Risk for impaired skin integrity will decrease Outcome: Adequate for Discharge   Problem: Education: Goal: Knowledge of disease or condition will improve Outcome: Adequate for Discharge Goal: Knowledge of secondary prevention will improve (MUST DOCUMENT ALL) Outcome: Adequate for Discharge Goal: Knowledge of patient  specific risk factors will improve Loraine Leriche N/A or DELETE if not current risk factor) Outcome: Adequate for Discharge   Problem: Acute Rehab PT Goals(only PT should resolve) Goal: Pt Will Ambulate Outcome: Adequate for Discharge Goal: Pt Will Go Up/Down Stairs Outcome: Adequate for Discharge

## 2023-11-29 NOTE — Progress Notes (Signed)
Patient brought up to the floor via wheelchair. Patient ambulated to bed without any difficulty. Patient is alert and oriented. Fall precaution initiated. Bed in low position, Call bell within reach.

## 2023-11-29 NOTE — TOC CM/SW Note (Signed)
Transition of Care [facility]) - Inpatient Brief Assessment   Patient Details  Name: ENGIE PERLSTEIN MRN: [id number] Date of Birth: 08-28-1966  Transition of Care [facility]) CM/SW Contact:    Villa Herb, LCSWA Phone Number: 11/29/2023, 10:09 AM   Clinical Narrative: Transition of Care Department [facility]) has reviewed patient and no TOC needs have been identified at this time. We will continue to monitor patient advancement through interdisciplinary progression rounds. If new patient transition needs arise, please place a TOC consult.  Transition of Care Asessment: Insurance and Status: Insurance coverage has been reviewed Patient has primary care physician: Yes Home environment has been reviewed: from home Prior level of function:: independent Prior/Current Home Services: No current home services Social Drivers of Health Review: SDOH reviewed no interventions necessary Readmission risk has been reviewed: Yes Transition of care needs: no transition of care needs at this time

## 2023-11-29 NOTE — Plan of Care (Signed)
  Problem: Acute Rehab PT Goals(only PT should resolve) Goal: Pt Will Ambulate Outcome: Progressing Flowsheets (Taken 11/29/2023 1158) Pt will Ambulate:  > 125 feet  Independently Goal: Pt Will Go Up/Down Stairs Outcome: Progressing Flowsheets (Taken 11/29/2023 1158) Pt will Go Up / Down Stairs:  3-5 stairs  Independently

## 2023-11-29 NOTE — Progress Notes (Signed)
*  PRELIMINARY RESULTS* Echocardiogram 2D Echocardiogram has been performed with [facility].  Stacey Drain 11/29/2023, 10:48 AM

## 2023-11-29 NOTE — Evaluation (Signed)
Physical Therapy Evaluation Patient Details Name: [name] MRN: [id number] DOB: 08-17-1966 Today's Date: 11/29/2023  History of Present Illness  Expand All Collapse All    untitled image  History and Physical   GENIA KAMINSKA KGU:[id number] DOB: [birth year] DOA: 11/28/2023  PCP: Tommie Sams, DO   Chief Complaint: syncope  Historian: patient     HPI:   [name] is a 57 y.o. female with a PMH significant for GERD, hypothyroidism, prediabetes, IBS, migraines, sleep apnea.  At baseline, they live at home with her husband and and are completely independent with ADLs.     They presented from work to the ED on 11/28/2023 with an episode of syncope.  Patient felt at her normal baseline this morning and proceeded to go to work.  While at work, she began to feel flushed, nauseous and slightly lightheaded so told her employer that she was going to go home.  While she was walking from her employer's office back to her own which is a short hallway, she felt herself becoming lightheaded and started to see little black spots in her vision and remembers falling to her hands and knees.  The next thing she remembers is a coworker standing above her likely a few seconds later and she felt lightheaded at that time.  She denies any appreciated weakness, pain after her fall.  She denies any recent illnesses, medication changes or changes in her daily routines.  Did not appreciate any flutter or palpitations.  Has no history of stroke, known cardiac arrhythmia.  She does not take birth control nor hormone replacement.  She is not a smoker.  At the time of our encounter, she feels like she is at her normal baseline other than feeling generalized tiredness.    Clinical Impression  Patient lying in bed on therapist arrival.  Mother, husband and daughter at bedside.  Patient agreeable to therapist assessment.  Patient independent with all bed mobility and sit to stand transfer.  Able to walk in hallway x 100 ft with PT  Supervision/ Modified independent with no loss of balance on path deviation noted.  4+ to 5/5 all lower extremity and upper extremity MMT's grossly throughout and equal bilaterally.  No noted loss of sensation.  Will keep pt on PT caseload while admitted to continue to assess functional needs.           If plan is discharge home, recommend the following: Help with stairs or ramp for entrance   Can travel by private vehicle        Equipment Recommendations None recommended by PT  Recommendations for Other Services       Functional Status Assessment Patient has had a recent decline in their functional status and demonstrates the ability to make significant improvements in function in a reasonable and predictable amount of time.     Precautions / Restrictions Precautions Precautions: None Restrictions Weight Bearing Restrictions Per Provider Order: No      Mobility  Bed Mobility Overal bed mobility: Independent               Patient Response: Cooperative  Transfers Overall transfer level: Independent                      Ambulation/Gait Ambulation/Gait assistance: Modified independent (Device/Increase time), Supervision Gait Distance (Feet): 100 Feet Assistive device: None Gait Pattern/deviations: WFL(Within Functional Limits)       General Gait Details: slight decreased gait speed  Stairs  Wheelchair Mobility     Tilt Bed Tilt Bed Patient Response: Cooperative  Modified Rankin (Stroke Patients Only)       Balance Overall balance assessment: Modified Independent                                           Pertinent Vitals/Pain Pain Assessment Pain Assessment: No/denies pain    Home Living Family/patient expects to be discharged to:: Private residence Living Arrangements: Spouse/significant other Available Help at Discharge: Family;Available 24 hours/day Type of Home: House Home Access: Stairs to  enter Entrance Stairs-Rails: None Entrance Stairs-Number of Steps: 2   Home Layout: Able to live on main level with bedroom/bathroom Home Equipment: None      Prior Function Prior Level of Function : Independent/Modified Independent                     Extremity/Trunk Assessment             Cervical / Trunk Assessment Cervical / Trunk Assessment: Normal  Communication   Communication Communication: No apparent difficulties  Cognition Arousal: Alert Behavior During Therapy: WFL for tasks assessed/performed Overall Cognitive Status: Within Functional Limits for tasks assessed                                          General Comments      Exercises     Assessment/Plan    PT Assessment Patient needs continued PT services  PT Problem List Decreased strength;Decreased mobility       PT Treatment Interventions Patient/family education;Functional mobility training    PT Goals (Current goals can be found in the Care Plan section)  Acute Rehab PT Goals Patient Stated Goal: return home PT Goal Formulation: With patient/family Time For Goal Achievement: 12/13/23 Potential to Achieve Goals: Good    Frequency Min 2X/week     Co-evaluation               AM-PAC PT "6 Clicks" Mobility  Outcome Measure Help needed turning from your back to your side while in a flat bed without using bedrails?: None Help needed moving from lying on your back to sitting on the side of a flat bed without using bedrails?: None Help needed moving to and from a bed to a chair (including a wheelchair)?: None Help needed standing up from a chair using your arms (e.g., wheelchair or bedside chair)?: None Help needed to walk in hospital room?: None Help needed climbing 3-5 steps with a railing? : A Little 6 Click Score: 23    End of Session   Activity Tolerance: Patient tolerated treatment well Patient left: in bed;with family/visitor present;with call  bell/phone within reach Nurse Communication: Mobility status PT Visit Diagnosis: Muscle weakness (generalized) (M62.81)    Time: 6213-0865 PT Time Calculation (min) (ACUTE ONLY): 20 min   Charges:   PT Evaluation $PT Eval Low Complexity: 1 Low PT Treatments $Therapeutic Activity: 8-22 mins PT General Charges $$ ACUTE PT VISIT: 1 Visit         11:57 AM, 11/29/23 Rifky Lapre Small Langford Carias MPT [facility] [place] [phone] Ph:[phone]

## 2023-11-29 NOTE — Discharge Summary (Signed)
[name], is a 57 y.o. female  DOB 06-02-1966  MRN [id number].  Admission date:  11/28/2023  Admitting Physician  Leeroy Bock, MD  Discharge Date:  11/29/2023   Primary MD  Tommie Sams, DO  Recommendations for primary care physician for things to follow:  1)Please take Aspirin 81 mg daily along with Plavix 75 mg daily for 21 days then after that STOP the Plavix  and continue ONLY Aspirin 81 mg daily indefinitely--for secondary stroke Prevention  2)Please take Atorvastatin/Lipitor--- which is a cholesterol medicine to lower your cholesterol and for stroke prevention  3)Avoid ibuprofen/Advil/Aleve/Motrin/Goody Powders/Naproxen/BC powders/Meloxicam/Diclofenac/Indomethacin and other Nonsteroidal anti-inflammatory medications as these will make you more likely to bleed and can cause stomach ulcers, can also cause Kidney problems.   4)-Low calorie diet, portion control and increase physical activity  advised -Body mass index is 37.13 kg/m.  5) outpatient follow-up with neurologist Dr. Pearlean Brownie in about 3 months advised  Admission Diagnosis  Syncope and collapse [R55] Acute CVA (cerebrovascular accident) [facility]) [I63.9] Cerebrovascular accident (CVA), unspecified mechanism (HCC) [I63.9]   Discharge Diagnosis  Syncope and collapse [R55] Acute CVA (cerebrovascular accident) (HCC) [I63.9] Cerebrovascular accident (CVA), unspecified mechanism (HCC) [I63.9]    Principal Problem:   Acute CVA/Punctate acute infarct in the periatrial white matter on the left. Active Problems:   Syncope and collapse   Stenosis of right carotid artery   Stenosis of intracranial vessel/Severe focal stenosis in the M1 segment of Rt MCA   Middle cerebral artery stenosis/Severe focal stenosis in the M1 segment of Rt MCA   Obesity (BMI 30-39.9)   Hypothyroidism   Aortic atherosclerosis (HCC)   Primary hypertension    Cerebrovascular accident (CVA) [facility])      Past Medical History:  Diagnosis Date   GERD (gastroesophageal reflux disease)    Hypothyroidism    IBS (irritable bowel syndrome)    S/P colonoscopy 07/13/2010   [place]: no results available. Reportedly done because heme +    Past Surgical History:  Procedure Laterality Date   CESAREAN SECTION     X 2   CHOLECYSTECTOMY     KNEE SURGERY       HPI  from the history and physical done on the day of admission:   HPI:  [name] is a 57 y.o. female with a PMH significant for GERD, hypothyroidism, prediabetes, IBS, migraines, sleep apnea. At baseline, they live at home with her husband and and are completely independent with ADLs.   They presented from work to the ED on 11/28/2023 with an episode of syncope.  Patient felt at her normal baseline this morning and proceeded to go to work.  While at work, she began to feel flushed, nauseous and slightly lightheaded so told her employer that she was going to go home.  While she was walking from her employer's office back to her own which is a short hallway, she felt herself becoming lightheaded and started to see little black spots in her vision and remembers falling to her hands  and knees.  The next thing she remembers is a coworker standing above her likely a few seconds later and she felt lightheaded at that time.  She denies any appreciated weakness, pain after her fall. She denies any recent illnesses, medication changes or changes in her daily routines.  Did not appreciate any flutter or palpitations. Has no history of stroke, known cardiac arrhythmia. She does not take birth control nor hormone replacement.  She is not a smoker. At the time of our encounter, she feels like she is at her normal baseline other than feeling generalized tiredness.   In the ED, it was found that they had mild tachycardia up to 108 heart rate, hypertension up to 172/101.  Normal O2 sats on room air and  afebrile..  Significant findings included: Unremarkable CBC, and unremarkable metabolic panel.  Troponins are negative x 2. ECG sinus tachycardia Chest x-ray negative acute process CT head: Negative for acute intracranial abnormality Brain MRI: Acute punctate infarct on the left periatrial white matter, Loss of the normal flow signal in the M1 segment of the right MCA, worrisome for a site high-grade stenosis. Recommend further evaluation with dedicated CTA head and neck angiogram. 3. Mucosal thickening and air-fluid levels in the bilateral sphenoid and left maxillary sinus, which can be seen in the setting of acute sinusitis. CTA head/neck: Severe focal stenosis in the M1 segment of the right middle cerebral artery as suggested by MRI. Patient would be at risk of right MCA occlusion. 2. Mild soft plaque at the right carotid bifurcation but no stenosis. 3. No other intracranial stenosis.   They were initially treated with nothing. Neurology was consulted and recommended admission to [facility].    Patient was admitted to medicine service for further workup and management of acute CVA as outlined in detail below.    Hospital Course:   1)Acute CVA of Left Parietal white matter  severe stenosis of right middle cerebral artery- -MRI brain shows-Punctate acute infarct in the periatrial white matter on the Left - CTA head and neck shows-Severe focal stenosis in the M1 segment of the right middle cerebral artery as suggested by MRI. Patient would be at risk of right MCA occlusion and Mild soft plaque at the right carotid bifurcation but no stenosis. -LDL is 158, HDL is 60 triglycerides 1 16X cholesterol 245 -A1c is 5.3 -Echo with EF greater than 75% with hyperdynamic left ventricular function, no regional wall motion abnormalities, no mitral stenosis, no aortic stenosis, no atrial level shunt detected by color-flow Doppler -patient actually presented with syncope which is probably  unrelated to this incidental stroke mmhg We initially allowed permissive hypertension up to 220 systolic -Neurology consult appreciated  take Aspirin 81 mg daily along with Plavix 75 mg daily for 21 days then after that STOP the Plavix  and continue ONLY Aspirin 81 mg daily indefinitely--for secondary stroke Prevention (Per The multicenter SAMMPRIS trial) --Lipitor as prescribed, LDL goal less than 70 -Physical therapy eval appreciated -Outpatient follow-up with neurologist in 3 months advised    2)Syncope--- Patient has a small stroke as well as unilateral severe stenosis of M1 branch of MCA --- unlikely that this is related to her syncope  --sounds like it is probably more related to acute orthostatic symptoms with a viral onset and changing in positions from office to office.  There is evidence of acute sinusitis on her head imaging. -Supportive care as needed   3)Hypothyroidism-endorses adherence with her medications -TSH 8.6 F/u with PCP for  recheck of T3 and T4 and adjustment of thyroid medications  4)Class 2 Obesity- -Low calorie diet, portion control and increase physical activity discussed with patient -Body mass index is 37.13 kg/m.  5)GERD----continue Protonix and Carafate, avoid NSAIDs especially while on DAPT  Discharge Condition: stable  Follow UP   Follow-up Information     Micki Riley, MD. Schedule an appointment as soon as possible for a visit in 3 month(s).   Specialties: Neurology, Radiology Why: Stroke follow-up Contact information: [address] [place] [address] [phone]                  Consults obtained -Neuro  Diet and Activity recommendation:  As advised  Discharge Instructions   Discharge Instructions     Ambulatory referral to Neurology   Complete by: As directed    An appointment is requested in approximately: 8-12 weeks   Call MD for:  difficulty breathing, headache or visual disturbances   Complete by:  As directed    Call MD for:  persistant dizziness or light-headedness   Complete by: As directed    Call MD for:  persistant nausea and vomiting   Complete by: As directed    Call MD for:  temperature >100.4   Complete by: As directed    Diet - low sodium heart healthy   Complete by: As directed    Discharge instructions   Complete by: As directed    1)Please take Aspirin 81 mg daily along with Plavix 75 mg daily for 21 days then after that STOP the Plavix  and continue ONLY Aspirin 81 mg daily indefinitely--for secondary stroke Prevention  2)Please take Atorvastatin/Lipitor--- which is a cholesterol medicine to lower your cholesterol and for stroke prevention  3)Avoid ibuprofen/Advil/Aleve/Motrin/Goody Powders/Naproxen/BC powders/Meloxicam/Diclofenac/Indomethacin and other Nonsteroidal anti-inflammatory medications as these will make you more likely to bleed and can cause stomach ulcers, can also cause Kidney problems.   4)-Low calorie diet, portion control and increase physical activity  advised -Body mass index is 37.13 kg/m.  5) outpatient follow-up with neurologist Dr. Pearlean Brownie in about 3 months advised   Increase activity slowly   Complete by: As directed         Discharge Medications     Allergies as of 11/29/2023       Reactions   Erythromycin Hives   Amoxil [amoxicillin] Rash   Rash on palms of hands; peeling of hands         Medication List     TAKE these medications    acetaminophen 325 MG tablet Commonly known as: TYLENOL Take 2 tablets (650 mg total) by mouth every 6 (six) hours as needed for mild pain (pain score 1-3), fever or headache (or Fever >/= 100.4).   ALPRAZolam 0.5 MG tablet Commonly known as: XANAX Take 0.5 mg by mouth at bedtime as needed for anxiety.   aspirin EC 81 MG tablet Take 1 tablet (81 mg total) by mouth daily with breakfast. take Aspirin 81 mg daily along with Plavix 75 mg daily for 21 days then after that STOP the Plavix  and  continue ONLY Aspirin 81 mg daily indefinitely--for secondary stroke Prevention   atorvastatin 40 MG tablet Commonly known as: LIPITOR Take 1 tablet (40 mg total) by mouth daily. For stroke prevention Start taking on: November 30, 2023   clopidogrel 75 MG tablet Commonly known as: PLAVIX Take 1 tablet (75 mg total) by mouth daily. take Aspirin 81 mg daily along with Plavix 75  mg daily for 21 days then after that STOP the Plavix  and continue ONLY Aspirin 81 mg daily indefinitely--for secondary stroke Prevention Start taking on: November 30, 2023   famotidine 20 MG tablet Commonly known as: PEPCID Take 1 tablet (20 mg total) by mouth 2 (two) times daily. What changed:  when to take this reasons to take this   levothyroxine 88 MCG tablet Commonly known as: Synthroid Take 1 tablet (88 mcg total) by mouth daily before breakfast.   pantoprazole 40 MG tablet Commonly known as: PROTONIX Take 1 tablet (40 mg total) by mouth daily.   sucralfate 1 g tablet Commonly known as: Carafate Take 1 tablet (1 g total) by mouth 4 (four) times daily -  with meals and at bedtime. What changed:  when to take this reasons to take this        Major procedures and Radiology Reports - PLEASE review detailed and final reports for all details, in brief -   ECHOCARDIOGRAM COMPLETE Result Date: 11/29/2023    ECHOCARDIOGRAM REPORT   Patient Name:   [name] [facility] Date of Exam: 11/29/2023 Medical Rec #:  [id number]      Height:       65.0 in Accession #:    [id number]     Weight:       223.1 lb Date of Birth:  [birth year]      BSA:          2.072 m Patient Age:    57 years       BP:           167/82 mmHg Patient Gender: F              HR:           98 bpm. [facility] Procedure: 2D Echo, Cardiac Doppler, Color Doppler and Intracardiac            Opacification Agent Indications:    Stroke l63.9  History:        Patient has no prior history of Echocardiogram examinations.                  Signs/Symptoms:Syncope; Risk Factors:Hypertension, Prediabetes                 and Sleep Apnea.  Sonographer:    Celesta Gentile RCS Referring Phys: [id number] CHELSEY L ANDERSON IMPRESSIONS  1. Left ventricular ejection fraction, by estimation, is >75%. The left ventricle has hyperdynamic function. The left ventricle has no regional wall motion abnormalities. Indeterminate diastolic filling due to E-A fusion.  2. Right ventricular systolic function is normal. The right ventricular size is normal.  3. The mitral valve is normal in structure. No evidence of mitral valve regurgitation. No evidence of mitral stenosis.  4. The aortic valve is tricuspid. Aortic valve regurgitation is not visualized. No aortic stenosis is present.  5. The inferior vena cava is normal in size with greater than 50% respiratory variability, suggesting right atrial pressure of 3 mmHg. FINDINGS  Left Ventricle: Left ventricular ejection fraction, by estimation, is >75%. The left ventricle has hyperdynamic function. The left ventricle has no regional wall motion abnormalities. Definity contrast agent was given IV to delineate the left ventricular endocardial borders. The left ventricular internal cavity size was normal in size. There is no left ventricular hypertrophy. Indeterminate diastolic filling due to E-A fusion. Right Ventricle: The right ventricular size is normal. Right vetricular wall thickness was not well visualized. Right ventricular  systolic function is normal. Left Atrium: Left atrial size was normal in size. Right Atrium: Right atrial size was normal in size. Pericardium: There is no evidence of pericardial effusion. Mitral Valve: The mitral valve is normal in structure. No evidence of mitral valve regurgitation. No evidence of mitral valve stenosis. Tricuspid Valve: The tricuspid valve is normal in structure. Tricuspid valve regurgitation is not demonstrated. No evidence of tricuspid stenosis. Aortic Valve: The aortic valve is  tricuspid. Aortic valve regurgitation is not visualized. No aortic stenosis is present. Aortic valve mean gradient measures 8.0 mmHg. Aortic valve peak gradient measures 15.4 mmHg. Aortic valve area, by VTI measures 2.34  cm. Pulmonic Valve: The pulmonic valve was not well visualized. Pulmonic valve regurgitation is not visualized. No evidence of pulmonic stenosis. Aorta: The aortic root is normal in size and structure and the ascending aorta was not well visualized. Venous: The inferior vena cava is normal in size with greater than 50% respiratory variability, suggesting right atrial pressure of 3 mmHg. IAS/Shunts: No atrial level shunt detected by color flow Doppler.  LEFT VENTRICLE PLAX 2D LVIDd:         4.50 cm   Diastology LVIDs:         2.30 cm   LV e' lateral:   12.70 cm/s LV PW:         0.90 cm   LV E/e' lateral: 11.3 LV IVS:        1.00 cm LVOT diam:     1.90 cm LV SV:         80 LV SV Index:   39 LVOT Area:     2.84 cm  RIGHT VENTRICLE RV S prime:     21.10 cm/s TAPSE (M-mode): 3.0 cm LEFT ATRIUM             Index        RIGHT ATRIUM           Index LA diam:        3.50 cm 1.69 cm/m   RA Area:     12.20 cm LA Vol (A2C):   24.8 ml 11.97 ml/m  RA Volume:   26.30 ml  12.69 ml/m LA Vol (A4C):   42.0 ml 20.27 ml/m LA Biplane Vol: 33.3 ml 16.07 ml/m  AORTIC VALVE AV Area (Vmax):    2.17 cm AV Area (Vmean):   2.12 cm AV Area (VTI):     2.34 cm AV Vmax:           196.00 cm/s AV Vmean:          124.000 cm/s AV VTI:            0.343 m AV Peak Grad:      15.4 mmHg AV Mean Grad:      8.0 mmHg LVOT Vmax:         150.00 cm/s LVOT Vmean:        92.700 cm/s LVOT VTI:          0.283 m LVOT/AV VTI ratio: 0.83  AORTA Ao Root diam: 3.10 cm MITRAL VALVE MV Area (PHT): 6.32 cm     SHUNTS MV Decel Time: 120 msec     Systemic VTI:  0.28 m MV E velocity: 144.00 cm/s  Systemic Diam: 1.90 cm Dina Rich MD Electronically signed by Dina Rich MD Signature Date/Time: 11/29/2023/12:11:51 PM    Final    CT ANGIO  HEAD NECK W WO CM Result Date: 11/28/2023 CLINICAL DATA:  Abnormal  right M1 segment by MR angiography, concern for high-grade stenosis. EXAM: CT ANGIOGRAPHY HEAD AND NECK WITH AND WITHOUT CONTRAST TECHNIQUE: Multidetector CT imaging of the head and neck was performed using the standard protocol during bolus administration of intravenous contrast. Multiplanar CT image reconstructions and MIPs were obtained to evaluate the vascular anatomy. Carotid stenosis measurements (when applicable) are obtained utilizing NASCET criteria, using the distal internal carotid diameter as the denominator. RADIATION DOSE REDUCTION: This exam was performed according to the departmental dose-optimization program which includes automated exposure control, adjustment of the mA and/or kV according to patient size and/or use of iterative reconstruction technique. CONTRAST:  75mL OMNIPAQUE IOHEXOL 350 MG/ML SOLN COMPARISON:  CT and MRI studies earlier same day. FINDINGS: CTA NECK FINDINGS Aortic arch: Normal Right carotid system: Common carotid artery widely patent to the bifurcation. Mild soft plaque at the bifurcation but no stenosis. Cervical ICA widely patent. Left carotid system: Left carotid system is normal. Vertebral arteries: Both vertebral artery origins are widely patent. Both vessels are normal through the cervical region to the foramen magnum. Skeleton: Normal Other neck: No mass or lymphadenopathy. Upper chest: Lung apices are clear. Review of the MIP images confirms the above findings CTA HEAD FINDINGS Anterior circulation: Both internal carotid arteries are patent through the skull base and siphon regions. The left anterior and middle cerebral arteries are widely patent. On the right, there is severe stenosis in the M1 segment as suggested by MRI. Patient would be at risk of right MCA occlusion. Posterior circulation: Both vertebral arteries are patent to the basilar artery. No basilar stenosis. Posterior circulation branch  vessels are patent. Venous sinuses: Patent and normal. Anatomic variants: None significant. Review of the MIP images confirms the above findings IMPRESSION: 1. Severe focal stenosis in the M1 segment of the right middle cerebral artery as suggested by MRI. Patient would be at risk of right MCA occlusion. 2. Mild soft plaque at the right carotid bifurcation but no stenosis. 3. No other intracranial stenosis. Electronically Signed   By: Paulina Fusi M.D.   On: 11/28/2023 16:21   MR BRAIN WO CONTRAST Result Date: 11/28/2023 CLINICAL DATA:  Transient ischemic attack (TIA) EXAM: MRI HEAD WITHOUT CONTRAST MRA HEAD WITHOUT CONTRAST TECHNIQUE: Multiplanar, multi-echo pulse sequences of the brain and surrounding structures were acquired without intravenous contrast. Angiographic images of the Circle of Willis were acquired using MRA technique without intravenous contrast. COMPARISON:  See same day CT head FINDINGS: MRI HEAD FINDINGS Brain: Punctate acute infarct in the periatrial white matter on the left (series 6, image 23). No hemorrhage. No hydrocephalus. No extra-axial fluid collection. No mass effect. No mass lesion. Vascular: See below Skull and upper cervical spine: Normal marrow signal. Sinuses/Orbits: No middle ear effusion. Trace right mastoid effusion. Mucosal thickening and air-fluid levels in the bilateral sphenoid and left maxillary sinus. Orbits are unremarkable. Other: None. MRA HEAD FINDINGS Anterior circulation: There is loss of the normal flow signal in the M1 segment of the right MCA (series 1009, image 7). This is worrisome for a site of high-grade stenosis Posterior circulation: The V4 segment of the right vertebral artery is not visualized. Otherwise there is no evidence of aneurysm or vascular malformation. Bilateral PCAs are without evidence of proximal occlusion or significant stenosis. Anatomic variants: None IMPRESSION: 1. Punctate acute infarct in the periatrial white matter on the left. 2.  Loss of the normal flow signal in the M1 segment of the right MCA, worrisome for a site high-grade stenosis. Recommend further  evaluation with dedicated CTA head and neck angiogram. 3. Mucosal thickening and air-fluid levels in the bilateral sphenoid and left maxillary sinus, which can be seen in the setting of acute sinusitis. Electronically Signed   By: Lorenza Cambridge M.D.   On: 11/28/2023 15:19   MR ANGIO HEAD WO CONTRAST Result Date: 11/28/2023 CLINICAL DATA:  Transient ischemic attack (TIA) EXAM: MRI HEAD WITHOUT CONTRAST MRA HEAD WITHOUT CONTRAST TECHNIQUE: Multiplanar, multi-echo pulse sequences of the brain and surrounding structures were acquired without intravenous contrast. Angiographic images of the Circle of Willis were acquired using MRA technique without intravenous contrast. COMPARISON:  See same day CT head FINDINGS: MRI HEAD FINDINGS Brain: Punctate acute infarct in the periatrial white matter on the left (series 6, image 23). No hemorrhage. No hydrocephalus. No extra-axial fluid collection. No mass effect. No mass lesion. Vascular: See below Skull and upper cervical spine: Normal marrow signal. Sinuses/Orbits: No middle ear effusion. Trace right mastoid effusion. Mucosal thickening and air-fluid levels in the bilateral sphenoid and left maxillary sinus. Orbits are unremarkable. Other: None. MRA HEAD FINDINGS Anterior circulation: There is loss of the normal flow signal in the M1 segment of the right MCA (series 1009, image 7). This is worrisome for a site of high-grade stenosis Posterior circulation: The V4 segment of the right vertebral artery is not visualized. Otherwise there is no evidence of aneurysm or vascular malformation. Bilateral PCAs are without evidence of proximal occlusion or significant stenosis. Anatomic variants: None IMPRESSION: 1. Punctate acute infarct in the periatrial white matter on the left. 2. Loss of the normal flow signal in the M1 segment of the right MCA, worrisome  for a site high-grade stenosis. Recommend further evaluation with dedicated CTA head and neck angiogram. 3. Mucosal thickening and air-fluid levels in the bilateral sphenoid and left maxillary sinus, which can be seen in the setting of acute sinusitis. Electronically Signed   By: Lorenza Cambridge M.D.   On: 11/28/2023 15:19   DG Chest Port 1 View Result Date: 11/28/2023 CLINICAL DATA:  Syncope.  Hypothyroidism. EXAM: PORTABLE CHEST 1 VIEW COMPARISON:  Radiographs 10/30/2023 and 02/16/2023. FINDINGS: 1153 hours. The heart size and mediastinal contours are normal. The lungs are clear. There is no pleural effusion or pneumothorax. No acute osseous findings are identified. Telemetry leads overlie the chest. IMPRESSION: No evidence of acute cardiopulmonary process. Electronically Signed   By: Carey Bullocks M.D.   On: 11/28/2023 12:38   CT HEAD WO CONTRAST Result Date: 11/28/2023 CLINICAL DATA:  Syncope/presyncope, cerebrovascular cause suspected. EXAM: CT HEAD WITHOUT CONTRAST TECHNIQUE: Contiguous axial images were obtained from the base of the skull through the vertex without intravenous contrast. RADIATION DOSE REDUCTION: This exam was performed according to the departmental dose-optimization program which includes automated exposure control, adjustment of the mA and/or kV according to patient size and/or use of iterative reconstruction technique. COMPARISON:  None Available. FINDINGS: Brain: There is no evidence of an acute infarct, intracranial hemorrhage, mass, midline shift, or extra-axial fluid collection. Cerebral volume is normal with normal size of the ventricles. Vascular: No hyperdense vessel. Skull: No acute fracture or suspicious osseous lesion. Sinuses/Orbits: Mild mucosal thickening and secretions in the sphenoid sinuses. Clear mastoid air cells. Unremarkable included orbits. Other: None. IMPRESSION: No evidence of acute intracranial abnormality. Electronically Signed   By: Sebastian Ache M.D.   On:  11/28/2023 12:33   Today   Subjective    [name] today has no new complaints No fever  Or chills  her mother  and teenage daughter at bedside --questions answered No fever  Or chills   No Nausea, Vomiting or Diarrhea   Patient has been seen and examined prior to discharge   Objective   Blood pressure (!) 167/82, pulse 98, temperature 98.7 F (37.1 C), temperature source Oral, resp. rate 20, height 5\' 5"  (1.651 m), weight 101.2 kg, last menstrual period 01/11/2016, SpO2 98%.   Intake/Output Summary (Last 24 hours) at 11/29/2023 1336 Last data filed at 11/29/2023 0834 Gross per 24 hour  Intake 240 ml  Output --  Net 240 ml    Exam Gen:- Awake Alert, no acute distress  HEENT:- [place].AT, No sclera icterus Neck-Supple Neck,No JVD,.  Lungs-  CTAB , good air movement bilaterally CV- S1, S2 normal, regular Abd-  +ve B.Sounds, Abd Soft, No tenderness,    Extremity/Skin:- No  edema,   good pulses Psych-affect is appropriate, oriented x3 Neuro-no significant gross new focal deficits, no tremors    Data Review   CBC w Diff:  Lab Results  Component Value Date   WBC 8.7 11/29/2023   HGB 13.0 11/29/2023   HGB 13.8 12/24/2021   HCT 40.2 11/29/2023   HCT 43.1 12/24/2021   PLT 321 11/29/2023   PLT 364 12/24/2021   LYMPHOPCT 13 11/28/2023   MONOPCT 7 11/28/2023   EOSPCT 1 11/28/2023   BASOPCT 0 11/28/2023    CMP:  Lab Results  Component Value Date   NA 139 11/29/2023   NA 145 (H) 12/24/2021   K 3.6 11/29/2023   CL 103 11/29/2023   CO2 26 11/29/2023   BUN 12 11/29/2023   BUN 12 12/24/2021   CREATININE 0.92 11/29/2023   PROT 7.5 11/28/2023   PROT 7.1 12/24/2021   ALBUMIN 3.8 11/28/2023   ALBUMIN 4.4 12/24/2021   BILITOT 1.2 (H) 11/28/2023   BILITOT 1.2 12/24/2021   ALKPHOS 94 11/28/2023   AST 27 11/28/2023   ALT 26 11/28/2023  .  Total Discharge time is about 33 minutes  Shon Hale M.D on 11/29/2023 at 1:36 PM  Go to [email or web address] -  for contact  info  [facility] - Office  [phone]

## 2023-12-02 ENCOUNTER — Ambulatory Visit: Payer: 59 | Admitting: Family Medicine

## 2023-12-02 VITALS — BP 152/92 | HR 101 | Temp 97.5°F | Ht 65.0 in | Wt 223.0 lb

## 2023-12-02 DIAGNOSIS — I10 Essential (primary) hypertension: Secondary | ICD-10-CM | POA: Diagnosis not present

## 2023-12-02 DIAGNOSIS — I639 Cerebral infarction, unspecified: Secondary | ICD-10-CM

## 2023-12-02 DIAGNOSIS — I6601 Occlusion and stenosis of right middle cerebral artery: Secondary | ICD-10-CM

## 2023-12-02 DIAGNOSIS — E038 Other specified hypothyroidism: Secondary | ICD-10-CM

## 2023-12-02 DIAGNOSIS — I6930 Unspecified sequelae of cerebral infarction: Secondary | ICD-10-CM

## 2023-12-02 MED ORDER — AMLODIPINE BESYLATE 5 MG PO TABS: 5.0000 mg | ORAL_TABLET | Freq: Every day | ORAL | 1 refills | Status: DC

## 2023-12-02 NOTE — Patient Instructions (Signed)
Medications as prescribed.  TSH + FT4 ordered.  Follow up in 2 weeks.

## 2023-12-05 ENCOUNTER — Encounter: Payer: Self-pay | Admitting: Family Medicine

## 2023-12-05 ENCOUNTER — Other Ambulatory Visit (HOSPITAL_COMMUNITY): Payer: Self-pay | Admitting: Interventional Radiology

## 2023-12-05 ENCOUNTER — Other Ambulatory Visit: Payer: Self-pay | Admitting: Family Medicine

## 2023-12-05 DIAGNOSIS — I771 Stricture of artery: Secondary | ICD-10-CM

## 2023-12-05 DIAGNOSIS — I10 Essential (primary) hypertension: Secondary | ICD-10-CM

## 2023-12-05 MED ORDER — LOSARTAN POTASSIUM 25 MG PO TABS: 25.0000 mg | ORAL_TABLET | Freq: Every day | ORAL | 3 refills | Status: DC

## 2023-12-05 NOTE — Assessment & Plan Note (Signed)
Blood pressure uncontrolled.  Starting on amlodipine.

## 2023-12-05 NOTE — Assessment & Plan Note (Signed)
Patient with significant stenosis of the M1 segment of the right MCA.  Referring to neurosurgery.

## 2023-12-05 NOTE — Progress Notes (Signed)
Subjective:  Patient ID: [name], female    DOB: Mar 18, 1966  Age: 57 y.o. MRN: [id number]  CC:   Chief Complaint  Patient presents with   Hospitalization Follow-up    Currently experiencing light headedness    lab result tsh    HPI:  57 year old female presents for hospital follow-up.  Patient presented to the ER after having a syncopal episode at work.  Syncope was thought to be from orthostasis.  Initial workup was negative.  MRI was ordered after patient endorsed generalized weakness.  There is a punctate infarct in the periatrial white matter.  There was also high-grade MCA stenosis.  This was confirmed on CTA.  Patient was admitted and did well during hospitalization.  Patient was discharged home on aspirin and Plavix and Lipitor.  TSH was elevated.  This needs follow-up.  Patient has neurology follow-up but is not until February.  Patient states that she feels lightheaded.  Blood pressure elevated here today.  Patient wants recheck of TSH.  She is very concerned about her recent hospitalization and the fact that this was rather abrupt and unexpected.  Patient Active Problem List   Diagnosis Date Noted   Middle cerebral artery stenosis/Severe focal stenosis in the M1 segment of Rt MCA 11/29/2023   Obesity (BMI 30-39.9) 11/29/2023   Acute CVA/Punctate acute infarct in the periatrial white matter on the left. 11/28/2023   Syncope and collapse 11/28/2023   Primary hypertension 11/28/2023   Hepatic steatosis 02/14/2023   Aortic atherosclerosis (HCC) 02/14/2023   Sleep apnea 02/14/2023   Irritable bowel syndrome with diarrhea 02/11/2023   Acute gastritis without hemorrhage 11/15/2022   Migraine 07/14/2020   Prediabetes 06/24/2017   Hypothyroidism 04/01/2016   GERD (gastroesophageal reflux disease) 03/10/2013    Social Hx   Social History   Socioeconomic History   Marital status: Married    Spouse name: Not on file   Number of children: 2   Years of education: Not  on file   Highest education level: Not on file  Occupational History   Occupation: Community education officer: [employer]    Comment: [place], [place]  Tobacco Use   Smoking status: Never   Smokeless tobacco: Never  Vaping Use   Vaping status: Never Used  Substance and Sexual Activity   Alcohol use: No    Alcohol/week: 0.0 standard drinks of alcohol   Drug use: No   Sexual activity: Yes    Partners: Male    Birth control/protection: None    Comment: spouse  Other Topics Concern   Not on file  Social History Narrative   Not on file   Social Drivers of Health   Financial Resource Strain: Not on file  Food Insecurity: No Food Insecurity (11/28/2023)   Hunger Vital Sign    Worried About Running Out of Food in the Last Year: Never true    Ran Out of Food in the Last Year: Never true  Transportation Needs: No Transportation Needs (11/28/2023)   PRAPARE - Administrator, Civil Service (Medical): No    Lack of Transportation (Non-Medical): No  Physical Activity: Not on file  Stress: Not on file  Social Connections: Not on file    Review of Systems Per HPI  Objective:  BP (!) 152/92   Pulse (!) 101   Temp (!) 97.5 F (36.4 C)   Ht 5\' 5"  (1.651 m)   Wt 223 lb (101.2 kg)   LMP 01/11/2016  SpO2 99%   BMI 37.11 kg/m      12/02/2023    2:53 PM 12/02/2023    2:31 PM 12/02/2023    2:16 PM  BP/Weight  Systolic BP 152 167 167  Diastolic BP 92 86 98  Wt. (Lbs)   223  BMI   37.11 kg/m2    Physical Exam Vitals and nursing note reviewed.  Constitutional:      General: She is not in acute distress. HENT:     Head: Normocephalic and atraumatic.  Eyes:     General:        Right eye: No discharge.        Left eye: No discharge.     Conjunctiva/sclera: Conjunctivae normal.  Cardiovascular:     Rate and Rhythm: Normal rate and regular rhythm.  Pulmonary:     Effort: Pulmonary effort is normal.     Breath sounds: Normal breath sounds. No wheezing or rales.   Neurological:     General: No focal deficit present.     Mental Status: She is alert.     Lab Results  Component Value Date   WBC 8.7 11/29/2023   HGB 13.0 11/29/2023   HCT 40.2 11/29/2023   PLT 321 11/29/2023   GLUCOSE 103 (H) 11/29/2023   CHOL 245 (H) 11/29/2023   TRIG 137 11/29/2023   HDL 60 11/29/2023   LDLCALC 158 (H) 11/29/2023   ALT 26 11/28/2023   AST 27 11/28/2023   NA 139 11/29/2023   K 3.6 11/29/2023   CL 103 11/29/2023   CREATININE 0.92 11/29/2023   BUN 12 11/29/2023   CO2 26 11/29/2023   TSH 8.602 (H) 11/29/2023   HGBA1C 5.3 11/28/2023     Assessment & Plan:   Problem List Items Addressed This Visit       Cardiovascular and Mediastinum   Acute CVA/Punctate acute infarct in the periatrial white matter on the left. - Primary   Patient to continue aspirin and Plavix as well as statin.      Middle cerebral artery stenosis/Severe focal stenosis in the M1 segment of Rt MCA   Patient with significant stenosis of the M1 segment of the right MCA.  Referring to neurosurgery.      Relevant Orders   Ambulatory referral to Neurosurgery   Ambulatory referral to Interventional Radiology   Primary hypertension   Blood pressure uncontrolled.  Starting on amlodipine.        Endocrine   Hypothyroidism   Relevant Orders   TSH + free T4    Meds ordered this encounter  Medications   DISCONTD: amLODipine (NORVASC) 5 MG tablet    Sig: Take 1 tablet (5 mg total) by mouth daily.    Dispense:  90 tablet    Refill:  1    Follow-up:  Return in about 2 weeks (around 12/16/2023).  Everlene Other DO [facility]

## 2023-12-05 NOTE — Assessment & Plan Note (Signed)
Patient to continue aspirin and Plavix as well as statin.

## 2023-12-12 ENCOUNTER — Telehealth: Payer: Self-pay

## 2023-12-12 NOTE — Telephone Encounter (Signed)
Received fax from [employer] that "patient has requested a delay in hookup"

## 2023-12-13 ENCOUNTER — Ambulatory Visit: Payer: 59 | Attending: Cardiovascular Disease

## 2023-12-13 DIAGNOSIS — I498 Other specified cardiac arrhythmias: Secondary | ICD-10-CM

## 2023-12-13 DIAGNOSIS — I639 Cerebral infarction, unspecified: Secondary | ICD-10-CM

## 2023-12-16 ENCOUNTER — Ambulatory Visit: Payer: 59 | Admitting: Physician Assistant

## 2023-12-16 ENCOUNTER — Encounter: Payer: Self-pay | Admitting: Physician Assistant

## 2023-12-16 VITALS — BP 130/82 | Temp 97.2°F | Ht 65.0 in | Wt 220.6 lb

## 2023-12-16 DIAGNOSIS — R051 Acute cough: Secondary | ICD-10-CM | POA: Diagnosis not present

## 2023-12-16 DIAGNOSIS — R0789 Other chest pain: Secondary | ICD-10-CM | POA: Diagnosis not present

## 2023-12-16 DIAGNOSIS — J208 Acute bronchitis due to other specified organisms: Secondary | ICD-10-CM | POA: Diagnosis not present

## 2023-12-16 MED ORDER — ALBUTEROL SULFATE HFA 108 (90 BASE) MCG/ACT IN AERS: 2.0000 | INHALATION_SPRAY | Freq: Four times a day (QID) | RESPIRATORY_TRACT | 0 refills | Status: DC | PRN

## 2023-12-16 NOTE — Progress Notes (Signed)
 Acute Office Visit  Subjective:     Patient ID: [name], female    DOB: 1966-04-16, 58 y.o.   MRN: [id number]   Cough Pertinent negatives include no chest pain, chills, ear pain, fever or sore throat.   Patient is in today for cough and congestion.  Patient reports approximately 1 week history of symptoms.  She reports symptoms first started with nasal congestion and rhinorrhea but over the course of the last week she described experiencing chest tightness and increase in cough.  She reports household sickness.  She states she has been taking OTC Mucinex with little relief of symptoms.  Patient denies fevers.  Review of Systems  Constitutional:  Negative for chills and fever.  HENT:  Positive for congestion. Negative for ear pain and sore throat.   Eyes: Negative.   Respiratory:  Positive for cough.   Cardiovascular:  Negative for chest pain and palpitations.  Gastrointestinal: Negative.   Musculoskeletal: Negative.         Objective:     BP 130/82   Temp (!) 97.2 F (36.2 C) (Oral)   Ht 5' 5 (1.651 m)   Wt 220 lb 9.6 oz (100.1 kg)   LMP 01/11/2016   BMI 36.71 kg/m   Physical Exam Vitals reviewed.  Constitutional:      General: She is not in acute distress.    Appearance: Normal appearance.  HENT:     Nose: Rhinorrhea present.     Mouth/Throat:     Mouth: Mucous membranes are moist.     Pharynx: Oropharynx is clear.  Eyes:     Extraocular Movements: Extraocular movements intact.     Conjunctiva/sclera: Conjunctivae normal.  Cardiovascular:     Rate and Rhythm: Normal rate and regular rhythm.     Heart sounds: No murmur heard.    No friction rub. No gallop.  Pulmonary:     Effort: Pulmonary effort is normal.     Breath sounds: Normal breath sounds. No stridor. No wheezing, rhonchi or rales.  Musculoskeletal:        General: Normal range of motion.  Skin:    General: Skin is warm and dry.     Capillary Refill: Capillary refill takes less than 2  seconds.  Neurological:     General: No focal deficit present.     Mental Status: She is alert and oriented to person, place, and time.  Psychiatric:        Mood and Affect: Mood normal.        Behavior: Behavior normal.     No results found for any visits on 12/16/23.      Assessment & Plan:  Viral bronchitis -     Albuterol  Sulfate HFA; Inhale 2 puffs into the lungs every 6 (six) hours as needed for wheezing or shortness of breath.  Dispense: 8 g; Refill: 0  Acute cough  Chest tightness -     Albuterol  Sulfate HFA; Inhale 2 puffs into the lungs every 6 (six) hours as needed for wheezing or shortness of breath.  Dispense: 8 g; Refill: 0   Patient appears stable today. Benign rather exam.  Lungs clear to auscultation bilaterally, no adventitious lung sounds.  Likely self-resolving viral infection. Supportive care reviewed with patient. Discussed with patient that there are no indications for antibiotics at this time, and viral respiratory illness can be persistent in duration. Albuterol  inhaler ordered today for symptomatic relief as patient was describing chest tightness.  Tylenol  for pain or  fever as needed. I advised Flonase for nasal congestion and ear pressure. May continue with OTC cold medications.  Patient will contact me if chest tightness or cough worsen over the weekend, will order chest x-ray at this time for further evaluation.  Patient instructed to return to clinic if worsening shortness of breath, chest pain, hypoxia, or other concerns. Patient agreeable to plan.    Return in about 6 days (around 12/22/2023) for next scheduled appointment with Dr. Bluford.  Charmaine Jack Bolio, PA-C

## 2023-12-19 ENCOUNTER — Ambulatory Visit (HOSPITAL_COMMUNITY)
Admission: RE | Admit: 2023-12-19 | Discharge: 2023-12-19 | Disposition: A | Discharge status: Home / Self Care | Payer: 59 | Source: Ambulatory Visit | Attending: Interventional Radiology | Admitting: Interventional Radiology

## 2023-12-19 ENCOUNTER — Ambulatory Visit: Payer: 59 | Admitting: Family Medicine

## 2023-12-19 DIAGNOSIS — I771 Stricture of artery: Secondary | ICD-10-CM

## 2023-12-20 ENCOUNTER — Ambulatory Visit: Payer: 59 | Admitting: Family Medicine

## 2023-12-20 ENCOUNTER — Other Ambulatory Visit (HOSPITAL_COMMUNITY): Payer: Self-pay | Admitting: Interventional Radiology

## 2023-12-20 ENCOUNTER — Telehealth (HOSPITAL_COMMUNITY): Payer: Self-pay

## 2023-12-20 DIAGNOSIS — I771 Stricture of artery: Secondary | ICD-10-CM

## 2023-12-20 DIAGNOSIS — I639 Cerebral infarction, unspecified: Secondary | ICD-10-CM

## 2023-12-20 DIAGNOSIS — I6930 Unspecified sequelae of cerebral infarction: Secondary | ICD-10-CM | POA: Diagnosis not present

## 2023-12-20 DIAGNOSIS — I6601 Occlusion and stenosis of right middle cerebral artery: Secondary | ICD-10-CM

## 2023-12-20 DIAGNOSIS — I10 Essential (primary) hypertension: Secondary | ICD-10-CM

## 2023-12-20 HISTORY — PX: IR RADIOLOGIST EVAL & MGMT: IMG5224

## 2023-12-20 NOTE — Assessment & Plan Note (Signed)
 Patient to discontinue Plavix after 21 days.  Continue aspirin.

## 2023-12-20 NOTE — Telephone Encounter (Signed)
Called to schedule diagnostic angiogram, no answer, left vm. AB  

## 2023-12-20 NOTE — Assessment & Plan Note (Signed)
 Improved on losartan.  Continue.

## 2023-12-20 NOTE — Progress Notes (Signed)
 Subjective:  Patient ID: [name], female    DOB: 1966-03-22  Age: 58 y.o. MRN: [id number]  CC:   Chief Complaint  Patient presents with   Hypertension    Follow up    HPI:  58 year old female with recent CVA of the periatrial white matter, middle cerebral artery stenosis at the M1 segment of the right MCA, hypertension presents for follow-up.  Hypertension improved following start of losartan .  Patient has been seen by interventional radiology.  Plan for angiogram and possible stenting.  Patient reports that her anxiety is improved.  She feels like she is improving but does not quite yet feel like her normal self.   Patient Active Problem List   Diagnosis Date Noted   Middle cerebral artery stenosis/Severe focal stenosis in the M1 segment of Rt MCA 11/29/2023   Obesity (BMI 30-39.9) 11/29/2023   Acute CVA/Punctate acute infarct in the periatrial white matter on the left. 11/28/2023   Primary hypertension 11/28/2023   Hepatic steatosis 02/14/2023   Aortic atherosclerosis (HCC) 02/14/2023   Sleep apnea 02/14/2023   Irritable bowel syndrome with diarrhea 02/11/2023   Acute gastritis without hemorrhage 11/15/2022   Migraine 07/14/2020   Prediabetes 06/24/2017   Hypothyroidism 04/01/2016   GERD (gastroesophageal reflux disease) 03/10/2013    Social Hx   Social History   Socioeconomic History   Marital status: Married    Spouse name: Not on file   Number of children: 2   Years of education: Not on file   Highest education level: Bachelor's degree (e.g., BA, AB, BS)  Occupational History   Occupation: Community Education Officer: [employer]    Comment: [place], [place]  Tobacco Use   Smoking status: Never   Smokeless tobacco: Never  Vaping Use   Vaping status: Never Used  Substance and Sexual Activity   Alcohol use: No    Alcohol/week: 0.0 standard drinks of alcohol   Drug use: No   Sexual activity: Yes    Partners: Male    Birth control/protection: None     Comment: spouse  Other Topics Concern   Not on file  Social History Narrative   Not on file   Social Drivers of Health   Financial Resource Strain: Low Risk  (12/20/2023)   Overall Financial Resource Strain (CARDIA)    Difficulty of Paying Living Expenses: Not hard at all  Food Insecurity: No Food Insecurity (12/20/2023)   Hunger Vital Sign    Worried About Running Out of Food in the Last Year: Never true    Ran Out of Food in the Last Year: Never true  Transportation Needs: No Transportation Needs (12/20/2023)   PRAPARE - Administrator, Civil Service (Medical): No    Lack of Transportation (Non-Medical): No  Physical Activity: Insufficiently Active (12/20/2023)   Exercise Vital Sign    Days of Exercise per Week: 4 days    Minutes of Exercise per Session: 20 min  Stress: Stress Concern Present (12/20/2023)   [employer] of Occupational Health - Occupational Stress Questionnaire    Feeling of Stress : To some extent  Social Connections: Unknown (12/20/2023)   Social Connection and Isolation Panel [NHANES]    Frequency of Communication with Friends and Family: Patient declined    Frequency of Social Gatherings with Friends and Family: Patient declined    Attends Religious Services: Patient declined    Database Administrator or Organizations: Patient declined    Attends [employer]  or Organization Meetings: Not on file    Marital Status: Married    Review of Systems Per HPI  Objective:  BP 135/84   Ht 5' 5 (1.651 m)   Wt 220 lb 9.6 oz (100.1 kg)   LMP 01/11/2016   BMI 36.71 kg/m      12/20/2023    2:20 PM 12/16/2023    2:40 PM 12/02/2023    2:53 PM  BP/Weight  Systolic BP 135 130 152  Diastolic BP 84 82 92  Wt. (Lbs) 220.6 220.6   BMI 36.71 kg/m2 36.71 kg/m2     Physical Exam Constitutional:      Appearance: Normal appearance.  Neurological:     Mental Status: She is alert.     Lab Results  Component Value Date   WBC 8.7 11/29/2023   HGB 13.0 11/29/2023    HCT 40.2 11/29/2023   PLT 321 11/29/2023   GLUCOSE 103 (H) 11/29/2023   CHOL 245 (H) 11/29/2023   TRIG 137 11/29/2023   HDL 60 11/29/2023   LDLCALC 158 (H) 11/29/2023   ALT 26 11/28/2023   AST 27 11/28/2023   NA 139 11/29/2023   K 3.6 11/29/2023   CL 103 11/29/2023   CREATININE 0.92 11/29/2023   BUN 12 11/29/2023   CO2 26 11/29/2023   TSH 8.602 (H) 11/29/2023   HGBA1C 5.3 11/28/2023     Assessment & Plan:   Problem List Items Addressed This Visit       Cardiovascular and Mediastinum   Acute CVA/Punctate acute infarct in the periatrial white matter on the left.   Patient to discontinue Plavix  after 21 days.  Continue aspirin .      Middle cerebral artery stenosis/Severe focal stenosis in the M1 segment of Rt MCA   Awaiting angiogram and possible stenting.  IR to arrange.      Primary hypertension   Improved on losartan .  Continue.       Follow-up:  3 months  Macky Galik Bluford DO [facility]

## 2023-12-20 NOTE — Assessment & Plan Note (Signed)
 Awaiting angiogram and possible stenting.  IR to arrange.

## 2023-12-20 NOTE — Patient Instructions (Addendum)
 Continue your medication.  Follow up in 3 months.  Message with concerns.  [phone] - Contact Morrie Sheldon

## 2023-12-28 ENCOUNTER — Other Ambulatory Visit: Payer: Self-pay | Admitting: Radiology

## 2023-12-28 DIAGNOSIS — I6609 Occlusion and stenosis of unspecified middle cerebral artery: Secondary | ICD-10-CM

## 2023-12-29 ENCOUNTER — Other Ambulatory Visit (HOSPITAL_COMMUNITY): Payer: Self-pay | Admitting: Interventional Radiology

## 2023-12-29 ENCOUNTER — Encounter (HOSPITAL_COMMUNITY): Payer: Self-pay

## 2023-12-29 ENCOUNTER — Other Ambulatory Visit: Payer: Self-pay

## 2023-12-29 ENCOUNTER — Ambulatory Visit (HOSPITAL_COMMUNITY)
Admission: RE | Admit: 2023-12-29 | Discharge: 2023-12-29 | Disposition: A | Discharge status: Home / Self Care | Payer: 59 | Source: Ambulatory Visit | Attending: Interventional Radiology | Admitting: Interventional Radiology

## 2023-12-29 DIAGNOSIS — I771 Stricture of artery: Secondary | ICD-10-CM

## 2023-12-29 DIAGNOSIS — I6601 Occlusion and stenosis of right middle cerebral artery: Secondary | ICD-10-CM | POA: Insufficient documentation

## 2023-12-29 DIAGNOSIS — I6609 Occlusion and stenosis of unspecified middle cerebral artery: Secondary | ICD-10-CM

## 2023-12-29 HISTORY — PX: IR ANGIO INTRA EXTRACRAN SEL COM CAROTID INNOMINATE BILAT MOD SED: IMG5360

## 2023-12-29 HISTORY — PX: IR US GUIDE VASC ACCESS RIGHT: IMG2390

## 2023-12-29 HISTORY — PX: IR ANGIO VERTEBRAL SEL VERTEBRAL UNI R MOD SED: IMG5368

## 2023-12-29 LAB — PROTIME-INR
INR: 1.1 (ref 0.8–1.2)
Prothrombin Time: 14.2 s (ref 11.4–15.2)

## 2023-12-29 LAB — CBC
HCT: 41.4 % (ref 36.0–46.0)
Hemoglobin: 13.3 g/dL (ref 12.0–15.0)
MCH: 26.2 pg (ref 26.0–34.0)
MCHC: 32.1 g/dL (ref 30.0–36.0)
MCV: 81.7 fL (ref 80.0–100.0)
Platelets: 261 10*3/uL (ref 150–400)
RBC: 5.07 MIL/uL (ref 3.87–5.11)
RDW: 14 % (ref 11.5–15.5)
WBC: 7.2 10*3/uL (ref 4.0–10.5)
nRBC: 0 % (ref 0.0–0.2)

## 2023-12-29 LAB — BASIC METABOLIC PANEL
Anion gap: 13 (ref 5–15)
BUN: 13 mg/dL (ref 6–20)
CO2: 25 mmol/L (ref 22–32)
Calcium: 9.3 mg/dL (ref 8.9–10.3)
Chloride: 103 mmol/L (ref 98–111)
Creatinine, Ser: 1.06 mg/dL — ABNORMAL HIGH (ref 0.44–1.00)
GFR, Estimated: >60 mL/min (ref >60)
Glucose, Bld: 107 mg/dL — ABNORMAL HIGH (ref 70–99)
Potassium: 3.5 mmol/L (ref 3.5–5.1)
Sodium: 141 mmol/L (ref 135–145)

## 2023-12-29 MED ORDER — VERAPAMIL HCL 2.5 MG/ML IV SOLN
INTRAVENOUS | Status: AC | PRN
  Administered 2023-12-29: 2.5 mg via INTRA_ARTERIAL

## 2023-12-29 MED ORDER — MIDAZOLAM HCL 2 MG/2ML IJ SOLN
INTRAMUSCULAR | Status: AC
Start: 2023-12-29 — End: ?
  Filled 2023-12-29: qty 2

## 2023-12-29 MED ORDER — NITROGLYCERIN 1 MG/10 ML FOR IR/CATH LAB
INTRA_ARTERIAL | Status: AC
  Filled 2023-12-29: qty 10

## 2023-12-29 MED ORDER — MIDAZOLAM HCL 2 MG/2ML IJ SOLN
INTRAMUSCULAR | Status: AC | PRN
  Administered 2023-12-29: 1 mg via INTRAVENOUS
  Administered 2023-12-29: .5 mg via INTRAVENOUS

## 2023-12-29 MED ORDER — POTASSIUM CHLORIDE IN NACL 20-0.9 MEQ/L-% IV SOLN
INTRAVENOUS | Status: DC
  Filled 2023-12-29: qty 1000

## 2023-12-29 MED ORDER — HYDRALAZINE HCL 20 MG/ML IJ SOLN
INTRAMUSCULAR | Status: AC
Start: 2023-12-29 — End: ?
  Filled 2023-12-29: qty 1

## 2023-12-29 MED ORDER — FENTANYL CITRATE (PF) 100 MCG/2ML IJ SOLN
INTRAMUSCULAR | Status: AC
  Filled 2023-12-29: qty 2

## 2023-12-29 MED ORDER — HYDRALAZINE HCL 20 MG/ML IJ SOLN
INTRAMUSCULAR | Status: AC | PRN
  Administered 2023-12-29: 5 mg via INTRAVENOUS

## 2023-12-29 MED ORDER — NITROGLYCERIN 1 MG/10 ML FOR IR/CATH LAB
INTRA_ARTERIAL | Status: AC | PRN
  Administered 2023-12-29 (×2): 200 ug via INTRA_ARTERIAL

## 2023-12-29 MED ORDER — HEPARIN SODIUM (PORCINE) 1000 UNIT/ML IJ SOLN
INTRAMUSCULAR | Status: AC | PRN
  Administered 2023-12-29: 2000 [IU] via INTRA_ARTERIAL

## 2023-12-29 MED ORDER — IOHEXOL 300 MG/ML  SOLN
150.0000 mL | Freq: Once | INTRAMUSCULAR | Status: AC | PRN
  Administered 2023-12-29: 50 mL via INTRA_ARTERIAL

## 2023-12-29 MED ORDER — LIDOCAINE HCL 1 % IJ SOLN
INTRAMUSCULAR | Status: AC
  Filled 2023-12-29: qty 20

## 2023-12-29 MED ORDER — HEPARIN SODIUM (PORCINE) 1000 UNIT/ML IJ SOLN
INTRAMUSCULAR | Status: AC
  Filled 2023-12-29: qty 10

## 2023-12-29 MED ORDER — VERAPAMIL HCL 2.5 MG/ML IV SOLN
INTRAVENOUS | Status: AC
  Filled 2023-12-29: qty 2

## 2023-12-29 MED ORDER — FENTANYL CITRATE (PF) 100 MCG/2ML IJ SOLN
INTRAMUSCULAR | Status: AC | PRN
  Administered 2023-12-29 (×2): 25 ug via INTRAVENOUS

## 2023-12-29 MED ORDER — SODIUM CHLORIDE 0.9 % IV SOLN: INTRAVENOUS | Status: DC

## 2023-12-29 NOTE — Sedation Documentation (Signed)
Patient transported to short stay with family. Maureen Ralphs RN at the bedside to receive patient handoff. TR band in place via right wrist. Clean, dry and intact, no drainage from dressing. Soft to palpation, no hematoma noted. +2 radial pulses intact.

## 2023-12-29 NOTE — H&P (Signed)
Chief Complaint: Patient was seen in consultation today for syncopal episode, R MCA stenosis at the request of Dr. Pearlean Brownie  Referring Physician(s): Dr. Pearlean Brownie  Supervising Physician:  Corliss Skains, Kathie Rhodes.   Patient Status: [facility] - Out-pt  History of Present Illness: [name] is a 58 y.o. female with PMHs of hypothyroidism, GERD, IBS who was recently evaluated for  syncopal episode and found to have  R MCA stenosis, she presents to [facility] for diagnostic cerebral angiogram.   She presented to ED on 11/28/23 due to syncopal episode, underwent stroke workup which showed severe focal stenosis in the M1 segment of the right middle cerebral artery. Neurology/stroke team recommended initiation of DAPT for 3 weeks and referral was made to Dr. Corliss Skains. Patient met with Dr. Corliss Skains on 12/19/23 when diagnostic cerebral angiogram was recommended which she decided to proceed.   Patient presents to [facility] today for the procedure.  Patient laying in bed, not in acute distress. Husband at the bedside.  Denise headache, vision changes, syncopal episode, fever, chills, shortness of breath, cough, chest pain, abdominal pain, nausea ,vomiting, and bleeding.    Past Medical History:  Diagnosis Date   GERD (gastroesophageal reflux disease)    Hypothyroidism    IBS (irritable bowel syndrome)    S/P colonoscopy 07/13/2010   [place]: no results available. Reportedly done because heme +    Past Surgical History:  Procedure Laterality Date   CESAREAN SECTION     X 2   CHOLECYSTECTOMY     IR RADIOLOGIST EVAL & MGMT  12/20/2023   KNEE SURGERY      Allergies: Erythromycin and Amoxil [amoxicillin]  Medications: Prior to Admission medications   Medication Sig Start Date End Date Taking? Authorizing Provider  acetaminophen (TYLENOL) 325 MG tablet Take 2 tablets (650 mg total) by mouth every 6 (six) hours as needed for mild pain (pain score 1-3), fever or headache (or Fever >/= 100.4). 11/29/23  Yes  Shon Hale, MD  aspirin EC 81 MG tablet Take 1 tablet (81 mg total) by mouth daily with breakfast. take Aspirin 81 mg daily along with Plavix 75 mg daily for 21 days then after that STOP the Plavix  and continue ONLY Aspirin 81 mg daily indefinitely--for secondary stroke Prevention 11/29/23  Yes Emokpae, Courage, MD  atorvastatin (LIPITOR) 40 MG tablet Take 1 tablet (40 mg total) by mouth daily. For stroke prevention 11/30/23  Yes Shon Hale, MD  famotidine (PEPCID) 20 MG tablet Take 1 tablet (20 mg total) by mouth 2 (two) times daily. Patient taking differently: Take 20 mg by mouth as needed. 02/16/23  Yes Gerhard Munch, MD  levothyroxine (SYNTHROID) 88 MCG tablet Take 1 tablet (88 mcg total) by mouth daily before breakfast. 05/27/23  Yes Cook, Jayce G, DO  losartan (COZAAR) 25 MG tablet Take 1 tablet (25 mg total) by mouth daily. 12/05/23  Yes Cook, Jayce G, DO  pantoprazole (PROTONIX) 20 MG tablet Take 20 mg by mouth daily.   Yes [provider]  albuterol (VENTOLIN HFA) 108 (90 Base) MCG/ACT inhaler Inhale 2 puffs into the lungs every 6 (six) hours as needed for wheezing or shortness of breath. 12/16/23   Grooms, Courtney, PA-C  ALPRAZolam (XANAX) 0.5 MG tablet Take 0.5 mg by mouth at bedtime as needed for anxiety. Patient not taking: Reported on 12/02/2023    [provider]     Family History  Problem Relation Age of Onset   Diabetes Father    Colon cancer Neg  Hx    Esophageal cancer Neg Hx    Rectal cancer Neg Hx    Stomach cancer Neg Hx     Social History   Socioeconomic History   Marital status: Married    Spouse name: Not on file   Number of children: 2   Years of education: Not on file   Highest education level: Bachelor's degree (e.g., BA, AB, BS)  Occupational History   Occupation: Community education officer: [employer]    Comment: [place],   Tobacco Use   Smoking status: Never   Smokeless tobacco: Never  Vaping Use   Vaping status: Never  Used  Substance and Sexual Activity   Alcohol use: No    Alcohol/week: 0.0 standard drinks of alcohol   Drug use: No   Sexual activity: Yes    Partners: Male    Birth control/protection: None    Comment: spouse  Other Topics Concern   Not on file  Social History Narrative   Not on file   Social Drivers of Health   Financial Resource Strain: Low Risk  (12/20/2023)   Overall Financial Resource Strain (CARDIA)    Difficulty of Paying Living Expenses: Not hard at all  Food Insecurity: No Food Insecurity (12/20/2023)   Hunger Vital Sign    Worried About Running Out of Food in the Last Year: Never true    Ran Out of Food in the Last Year: Never true  Transportation Needs: No Transportation Needs (12/20/2023)   PRAPARE - Administrator, Civil Service (Medical): No    Lack of Transportation (Non-Medical): No  Physical Activity: Insufficiently Active (12/20/2023)   Exercise Vital Sign    Days of Exercise per Week: 4 days    Minutes of Exercise per Session: 20 min  Stress: Stress Concern Present (12/20/2023)   [employer] of Occupational Health - Occupational Stress Questionnaire    Feeling of Stress : To some extent  Social Connections: Unknown (12/20/2023)   Social Connection and Isolation Panel [NHANES]    Frequency of Communication with Friends and Family: Patient declined    Frequency of Social Gatherings with Friends and Family: Patient declined    Attends Religious Services: Patient declined    Database administrator or Organizations: Patient declined    Attends Engineer, structural: Not on file    Marital Status: Married     Review of Systems: A 12 point ROS discussed and pertinent positives are indicated in the HPI above.  All other systems are negative.  Vital Signs: BP 133/80   Pulse 99   Temp 98.7 F (37.1 C) (Oral)   Resp 18   Ht 5\' 5"  (1.651 m)   Wt 217 lb (98.4 kg)   LMP 01/11/2016   SpO2 98%   BMI 36.11 kg/m    Physical Exam Vitals and  nursing note reviewed.  Constitutional:      General: Patient is not in acute distress.    Appearance: Normal appearance. Patient is not ill-appearing.  HENT:     Head: Normocephalic and atraumatic.     Mouth/Throat:     Mouth: Mucous membranes are moist.     Pharynx: Oropharynx is clear.  Cardiovascular:     Rate and Rhythm: Normal rate and regular rhythm.     Pulses: Normal pulses.     Heart sounds: Normal heart sounds.  Pulmonary:     Effort: Pulmonary effort is normal.     Breath sounds: Normal  breath sounds.  Abdominal:     General: Abdomen is flat. Bowel sounds are normal.     Palpations: Abdomen is soft.  Musculoskeletal:     Cervical back: Neck supple.  Skin:    General: Skin is warm and dry.     Coloration: Skin is not jaundiced or pale.  Neurological:     Mental Status: Patient is alert and oriented to person, place, and time.  Psychiatric:        Mood and Affect: Mood normal.        Behavior: Behavior normal.        Judgment: Judgment normal.    MD Evaluation Airway: WNL Heart: WNL Abdomen: WNL Chest/ Lungs: WNL ASA  Classification: 2 Mallampati/Airway Score: Two  Imaging: IR Radiologist Eval & Mgmt Result Date: 12/20/2023 EXAM: NEW PATIENT OFFICE VISIT CHIEF COMPLAINT: Abnormal CT angiogram of the head and neck, and MRI MRA of the brain. Current Pain Level: 1-10 HISTORY OF PRESENT ILLNESS: The patient is a 58 year old right handed lady referred for evaluation and management of a recently discovered severe high-grade stenosis of the right middle cerebral artery. The patient is accompanied by her husband. History obtained from the patient and the patient's electronic medical records. The patient is neuroimaging studies were reviewed with patient and the spouse. The patient presented to the emergency department on 11/28/2023 with an episode of syncope. The patient describes feeling flushed and nauseous and slightly warm followed by lightheadedness. She says that  black spots were evident in both visual fields followed by completely passing out. On coming around a few seconds later, the patient was diaphoretic without any headaches or nausea or vomiting or chest pains or palpitations. She reports possible hyperventilating prior to passing out. Upon recovery, the patient reports no changes in terms of vision, speech difficulties, or motor or sensory or coordination difficulties. She reports no perioral numbness, or dysarthria or speech difficulties. Patient underwent an MRI of the brain and MRA of the brain and a CT angiogram of the head and neck during her evaluation in the ER. The MRA revealed signal drop off in the right middle cerebral artery mid M1 segment which was shown on the CT angiogram of the head and neck. MRI FLAIR and T2 sequences also showed a nonspecific punctate hyperattenuation in the upper left cerebellar hemisphere of questionable significance. The patient reports no previous episodes of passing out spells, or seizure-like activity. She reports no difficulty with speech, motor, sensory or coordination. Patient reports no chest pain, shortness of breath or palpitations or paroxysmal nocturnal dyspnea. Her breathing is normal without wheezing, or productive coughing. Her appetite is normal. Weight is steady. Denies any difficulty with abdominal pains, constipation, diarrhea or melena. She reports no difficulty with dysuria, hematuria, or polyuria. No recent chills, fever or rigors. Diagnosis * : Date . * : GERD (gastroesophageal reflux disease) * : . * : Hypothyroidism * : . * : IBS (irritable bowel syndrome) * : . * : S/P colonoscopy * : 07/13/2010 * : [place]: no results available. Reportedly done because heme + : Family History Problem * : Relation * : Age of Onset . * : Diabetes * : Father * : . * : Colon cancer * : Neg Hx * : . * : Esophageal cancer * : Neg Hx * : . * : Rectal cancer * : Neg Hx * : . * : Stomach cancer * : Neg Hx * : Social History:  Reports that she has  never smoked. She has never used smokeless tobacco. She reports that she does not drink alcohol and does not use drugs. Medications Prior to Admission Medication * : Sig * : Dispense * : Refill * : Last Dose/Taking . * : ALPRAZolam (XANAX) 0.5 MG tablet * : Take 0.5 mg by mouth at bedtime as needed for anxiety. * : * : * : Past Week . * : famotidine (PEPCID) 20 MG tablet * : Take 1 tablet (20 mg total) by mouth 2 (two) times daily. (Patient taking differently: Take 20 mg by mouth as needed.) * : 30 tablet * : 0 * : Past Week . * : levothyroxine (SYNTHROID) 88 MCG tablet * : Take 1 tablet (88 mcg total) by mouth daily before breakfast. * : 90 tablet * : 2 * : 11/28/2023 . * : pantoprazole (PROTONIX) 40 MG tablet * : Take 1 tablet (40 mg total) by mouth daily. * : 90 tablet * : 3 * : 11/27/2023 . * : sucralfate (CARAFATE) 1 g tablet * : Take 1 tablet (1 g total) by mouth 4 (four) times daily - with meals and at bedtime. (Patient taking differently: Take 1 g by mouth as needed.) * : 21 tablet * : 0 * : 11/28/2023 REVIEW OF SYSTEMS: As above. PHYSICAL EXAMINATION: Alert, awake, oriented to time, place, space. Speech and comprehension intact. Normal eye contact. Appears in no acute distress. Grossly neurologically no abnormal lateralizing features evident. Station and gait normal. ASSESSMENT AND PLAN: The MRI and CT angiogram findings were shared with the patient and the spouse. Brought to their attention was the probable high-grade stenosis of the right middle cerebral artery M1 segment. This probably represents an incidental finding and probably not related to her symptoms described above. Further evaluation with a diagnostic catheter arteriogram was discussed in detail. The procedure, the benefits, the alternatives and potential complications were reviewed in detail. Procedure would be under conscious sedation via the right trans radial or transfemoral route. Following the procedure the patient  would spend about 4 hours recovery in short-stay. After careful consideration, the patient and the spouse decided to proceed with the diagnostic catheter arteriogram as soon as possible. In the meantime, the patient advised to continue with her present medical regimen and to maintain adequate hydration. They both leave with good understanding and agreement with the above management plan. Electronically Signed   By: Julieanne Cotton M.D.   On: 12/20/2023 08:00   ECHOCARDIOGRAM COMPLETE Result Date: 11/29/2023    ECHOCARDIOGRAM REPORT   Patient Name:   [name] [facility] Date of Exam: 11/29/2023 Medical Rec #:  [id number]      Height:       65.0 in Accession #:    [id number]     Weight:       223.1 lb Date of Birth:  June 25, 1966      BSA:          2.072 m Patient Age:    57 years       BP:           167/82 mmHg Patient Gender: F              HR:           98 bpm. [facility] Procedure: 2D Echo, Cardiac Doppler, Color Doppler and Intracardiac            Opacification Agent Indications:    Stroke l63.9  History:  Patient has no prior history of Echocardiogram examinations.                 Signs/Symptoms:Syncope; Risk Factors:Hypertension, Prediabetes                 and Sleep Apnea.  Sonographer:    Celesta Gentile RCS Referring Phys: [id number] CHELSEY L ANDERSON IMPRESSIONS  1. Left ventricular ejection fraction, by estimation, is >75%. The left ventricle has hyperdynamic function. The left ventricle has no regional wall motion abnormalities. Indeterminate diastolic filling due to E-A fusion.  2. Right ventricular systolic function is normal. The right ventricular size is normal.  3. The mitral valve is normal in structure. No evidence of mitral valve regurgitation. No evidence of mitral stenosis.  4. The aortic valve is tricuspid. Aortic valve regurgitation is not visualized. No aortic stenosis is present.  5. The inferior vena cava is normal in size with greater than 50% respiratory variability,  suggesting right atrial pressure of 3 mmHg. FINDINGS  Left Ventricle: Left ventricular ejection fraction, by estimation, is >75%. The left ventricle has hyperdynamic function. The left ventricle has no regional wall motion abnormalities. Definity contrast agent was given IV to delineate the left ventricular endocardial borders. The left ventricular internal cavity size was normal in size. There is no left ventricular hypertrophy. Indeterminate diastolic filling due to E-A fusion. Right Ventricle: The right ventricular size is normal. Right vetricular wall thickness was not well visualized. Right ventricular systolic function is normal. Left Atrium: Left atrial size was normal in size. Right Atrium: Right atrial size was normal in size. Pericardium: There is no evidence of pericardial effusion. Mitral Valve: The mitral valve is normal in structure. No evidence of mitral valve regurgitation. No evidence of mitral valve stenosis. Tricuspid Valve: The tricuspid valve is normal in structure. Tricuspid valve regurgitation is not demonstrated. No evidence of tricuspid stenosis. Aortic Valve: The aortic valve is tricuspid. Aortic valve regurgitation is not visualized. No aortic stenosis is present. Aortic valve mean gradient measures 8.0 mmHg. Aortic valve peak gradient measures 15.4 mmHg. Aortic valve area, by VTI measures 2.34  cm. Pulmonic Valve: The pulmonic valve was not well visualized. Pulmonic valve regurgitation is not visualized. No evidence of pulmonic stenosis. Aorta: The aortic root is normal in size and structure and the ascending aorta was not well visualized. Venous: The inferior vena cava is normal in size with greater than 50% respiratory variability, suggesting right atrial pressure of 3 mmHg. IAS/Shunts: No atrial level shunt detected by color flow Doppler.  LEFT VENTRICLE PLAX 2D LVIDd:         4.50 cm   Diastology LVIDs:         2.30 cm   LV e' lateral:   12.70 cm/s LV PW:         0.90 cm   LV E/e'  lateral: 11.3 LV IVS:        1.00 cm LVOT diam:     1.90 cm LV SV:         80 LV SV Index:   39 LVOT Area:     2.84 cm  RIGHT VENTRICLE RV S prime:     21.10 cm/s TAPSE (M-mode): 3.0 cm LEFT ATRIUM             Index        RIGHT ATRIUM           Index LA diam:        3.50 cm 1.69 cm/m  RA Area:     12.20 cm LA Vol (A2C):   24.8 ml 11.97 ml/m  RA Volume:   26.30 ml  12.69 ml/m LA Vol (A4C):   42.0 ml 20.27 ml/m LA Biplane Vol: 33.3 ml 16.07 ml/m  AORTIC VALVE AV Area (Vmax):    2.17 cm AV Area (Vmean):   2.12 cm AV Area (VTI):     2.34 cm AV Vmax:           196.00 cm/s AV Vmean:          124.000 cm/s AV VTI:            0.343 m AV Peak Grad:      15.4 mmHg AV Mean Grad:      8.0 mmHg LVOT Vmax:         150.00 cm/s LVOT Vmean:        92.700 cm/s LVOT VTI:          0.283 m LVOT/AV VTI ratio: 0.83  AORTA Ao Root diam: 3.10 cm MITRAL VALVE MV Area (PHT): 6.32 cm     SHUNTS MV Decel Time: 120 msec     Systemic VTI:  0.28 m MV E velocity: 144.00 cm/s  Systemic Diam: 1.90 cm Dina Rich MD Electronically signed by Dina Rich MD Signature Date/Time: 11/29/2023/12:11:51 PM    Final     Labs:  CBC: Recent Labs    11/28/23 1141 11/28/23 1819 11/29/23 0428 12/29/23 0650  WBC 10.4 10.1 8.7 7.2  HGB 13.0 13.3 13.0 13.3  HCT 41.6 41.0 40.2 41.4  PLT 281 305 321 261    COAGS: Recent Labs    12/29/23 0650  INR 1.1    BMP: Recent Labs    02/16/23 0803 11/28/23 1141 11/28/23 1819 11/29/23 0428 12/29/23 0650  NA 138 136  --  139 141  K 4.2 3.7  --  3.6 3.5  CL 102 102  --  103 103  CO2 24 23  --  26 25  GLUCOSE 106* 117*  --  103* 107*  BUN 10 13  --  12 13  CALCIUM 9.3 8.9  --  9.4 9.3  CREATININE 0.91 0.91 0.83 0.92 1.06*  GFRNONAA >60 >60 >60 >60 >60    LIVER FUNCTION TESTS: Recent Labs    02/09/23 0728 02/14/23 1727 02/16/23 0803 11/28/23 1141  BILITOT 1.5* 1.5* 2.0* 1.2*  AST 41 28 29 27   ALT 70* 35 34 26  ALKPHOS 135* 112 111 94  PROT 8.0 7.8 8.0 7.5   ALBUMIN 4.1 4.2 4.1 3.8    TUMOR MARKERS: No results for input(s): "AFPTM", "CEA", "CA199", "CHROMGRNA" in the last 8760 hours.  Assessment and Plan: 58 y.o. female with syncopal episode, R MCA stenosis who presents for diagnostic cerebral angiogram.   VSS CBC stable  RF creatinine 1.06, BUN 13. GFR > 60.  INR 1.1 On ASA 81 mg  Risks and benefits of cerebral angiogram with intervention were discussed with the patient including, but not limited to bleeding, infection, vascular injury, contrast induced renal failure, stroke or even death.  This interventional procedure involves the use of X-rays and because of the nature of the planned procedure, it is possible that we will have prolonged use of X-ray fluoroscopy.  Potential radiation risks to you include (but are not limited to) the following: - A slightly elevated risk for cancer  several years later in life. This risk is typically less than 0.5% percent. This risk is low  in comparison to the normal incidence of human cancer, which is 33% for women and 50% for men according to the [employer] Cancer Society. - Radiation induced injury can include skin redness, resembling a rash, tissue breakdown / ulcers and hair loss (which can be temporary or permanent).   The likelihood of either of these occurring depends on the difficulty of the procedure and whether you are sensitive to radiation due to previous procedures, disease, or genetic conditions.   IF your procedure requires a prolonged use of radiation, you will be notified and given written instructions for further action.  It is your responsibility to monitor the irradiated area for the 2 weeks following the procedure and to notify your physician if you are concerned that you have suffered a radiation induced injury.    All of the patient's questions were answered, patient is agreeable to proceed.  Consent signed and in chart.    Thank you for this interesting consult.  I greatly  enjoyed meeting [name] and look forward to participating in their care.  A copy of this report was sent to the requesting provider on this date.  Electronically Signed: Willette Brace, PA-C 12/29/2023, 8:09 AM   I spent a total of    25 Minutes in face to face in clinical consultation, greater than 50% of which was counseling/coordinating care for diagnostic cerebral angiogram.   This chart was dictated using voice recognition software.  Despite best efforts to proofread,  errors can occur which can change the documentation meaning.

## 2023-12-29 NOTE — Progress Notes (Signed)
Patient and husband was given discharge instructions. Both verbalized understanding. 

## 2023-12-29 NOTE — Procedures (Signed)
INR.  Status post bilateral common carotid artery, and right vertebral arteriograms.  Right radial approach.  Findings.  1.  Approximately 50% stenosis of the right middle cerebral artery mid M1 segment.  Patient tolerated the procedure well.  There were no acute complications.  Fatima Sanger MD.

## 2023-12-29 NOTE — Discharge Instructions (Signed)

## 2024-01-02 ENCOUNTER — Encounter: Payer: Self-pay | Admitting: Family Medicine

## 2024-01-05 ENCOUNTER — Telehealth (HOSPITAL_COMMUNITY): Payer: Self-pay

## 2024-01-05 NOTE — Telephone Encounter (Signed)
Pt had questions regarding f/u. She isn't due until July. I will call her in late June to get her scheduled. AB

## 2024-01-12 ENCOUNTER — Encounter: Payer: Self-pay | Admitting: Family Medicine

## 2024-01-12 LAB — TSH+FREE T4
Free T4: 1.31 ng/dL (ref 0.82–1.77)
TSH: 2.02 u[IU]/mL (ref 0.450–4.500)

## 2024-01-24 NOTE — Addendum Note (Signed)
Encounter addended by: Assyria Morreale H on: 01/24/2024 2:34 PM  Actions taken: Imaging Exam ended

## 2024-01-31 ENCOUNTER — Encounter: Payer: Self-pay | Admitting: Family Medicine

## 2024-02-13 ENCOUNTER — Ambulatory Visit: Payer: 59 | Admitting: Nurse Practitioner

## 2024-02-13 VITALS — BP 138/91 | HR 88 | Temp 98.1°F | Ht 65.0 in | Wt 221.4 lb

## 2024-02-13 DIAGNOSIS — I10 Essential (primary) hypertension: Secondary | ICD-10-CM

## 2024-02-13 DIAGNOSIS — Z79899 Other long term (current) drug therapy: Secondary | ICD-10-CM | POA: Diagnosis not present

## 2024-02-13 DIAGNOSIS — R42 Dizziness and giddiness: Secondary | ICD-10-CM

## 2024-02-13 DIAGNOSIS — E785 Hyperlipidemia, unspecified: Secondary | ICD-10-CM | POA: Diagnosis not present

## 2024-02-14 ENCOUNTER — Encounter: Payer: Self-pay | Admitting: Nurse Practitioner

## 2024-02-14 LAB — LIPID PANEL
Chol/HDL Ratio: 3 ratio (ref 0.0–4.4)
Cholesterol, Total: 172 mg/dL (ref 100–199)
HDL: 57 mg/dL (ref >39)
LDL Chol Calc (NIH): 91 mg/dL (ref 0–99)
Triglycerides: 136 mg/dL (ref 0–149)
VLDL Cholesterol Cal: 24 mg/dL (ref 5–40)

## 2024-02-14 LAB — HEPATIC FUNCTION PANEL
ALT: 32 IU/L (ref 0–32)
AST: 29 IU/L (ref 0–40)
Albumin: 4.6 g/dL (ref 3.8–4.9)
Alkaline Phosphatase: 135 IU/L — ABNORMAL HIGH (ref 44–121)
Bilirubin Total: 1.7 mg/dL — ABNORMAL HIGH (ref 0.0–1.2)
Bilirubin, Direct: 0.43 mg/dL — ABNORMAL HIGH (ref 0.00–0.40)
Total Protein: 7.5 g/dL (ref 6.0–8.5)

## 2024-02-14 LAB — CK: Total CK: 78 U/L (ref 32–182)

## 2024-02-14 NOTE — Progress Notes (Signed)
 Subjective:    Patient ID: [name], female    DOB: [birth year], 58 y.o.   MRN: [id number]  HPI Presents to discuss her cholesterol.  Currently on atorvastatin 40 mg daily.  Has noticed some joint pain particularly in the shoulders since starting medication.  More on the right side.  Patient missed a dose recently and noticed the symptoms improved.  This seems to be the main side effect.  States she just feels tired overall.  Notes that she lost her father a few months ago.  No longer taking her alprazolam for sleep.  Has felt lightheaded on occasion, will hold onto a wall for second and then she is fine.  Denies any syncopal episodes.  Minimal headache.  Currently on losartan 25 mg for her blood pressure.  Reports BP outside the office 117/70.  History of CVA with no residual effects that she is aware of.  Gets regular eye exams.  Sees gynecology for her preventive health physicals and mammograms.  Has an appointment with neurology in 2 days.   Review of Systems  Constitutional:  Positive for fatigue.  Respiratory:  Negative for cough, chest tightness, shortness of breath and wheezing.   Cardiovascular:  Negative for chest pain, palpitations and leg swelling.  Neurological:  Positive for light-headedness. Negative for syncope, facial asymmetry, speech difficulty, weakness and numbness.      02/13/2024    1:15 PM  Depression screen PHQ 2/9  Decreased Interest 0  Down, Depressed, Hopeless 0  PHQ - 2 Score 0  Altered sleeping 1  Tired, decreased energy 1  Change in appetite 0  Feeling bad or failure about yourself  0  Trouble concentrating 0  Moving slowly or fidgety/restless 0  Suicidal thoughts 0  PHQ-9 Score 2  Difficult doing work/chores Not difficult at all      02/13/2024    1:15 PM 12/16/2023    2:46 PM 12/02/2023    2:28 PM 02/11/2023    9:01 AM  GAD 7 : Generalized Anxiety Score  Nervous, Anxious, on Edge 1 1 1 1   Control/stop worrying 0 1 1 1   Worry too much - different  things 0 0 0 1  Trouble relaxing 0 0 0 1  Restless 0 0 0 1  Easily annoyed or irritable 0 0 0 1  Afraid - awful might happen 0 1 1 1   Total GAD 7 Score 1 3 3 7   Anxiety Difficulty Not difficult at all Not difficult at all Somewhat difficult Somewhat difficult         Objective:   Physical Exam NAD.  Alert, oriented.  Mildly fatigued in appearance.  Mildly anxious affect.  Making good eye contact.  Speech clear.  No facial asymmetry noted.  Gait normal limit.  Gets on and off exam table without difficulty.  Lungs clear.  Heart regular rate rhythm.  No murmur or gallop noted. Today's Vitals   02/13/24 1307  BP: (!) 138/91  Pulse: 88  Temp: 98.1 F (36.7 C)  SpO2: 97%  Weight: 221 lb 6.4 oz (100.4 kg)  Height: 5\' 5"  (1.651 m)   Body mass index is 36.84 kg/m. Orthostatic Vitals for the past 48 hrs (Last 6 readings):  Patient Position Orthostatic BP Orthostatic Pulse BP Pulse Cuff Size  02/13/24 1307 -- -- -- (!) 138/91 88 --  02/13/24 1402 Supine (!) 151/95 81 -- -- Large  02/13/24 1404 Sitting 144/89 79 -- -- Large  02/13/24 1405 Standing (!) 134/96 101 -- --  Large     01/11/2024 TSH and free T4 were normal.     Assessment & Plan:   Problem List Items Addressed This Visit       Cardiovascular and Mediastinum   Primary hypertension     Other   Hyperlipidemia - Primary   Relevant Orders   Lipid panel (Completed)   Other Visit Diagnoses       High risk medication use       Relevant Orders   Hepatic function panel (Completed)   CK (Completed)     Lightheadedness          Discussed options with patient regarding her cholesterol and possible side effects from atorvastatin.  Will go ahead and repeat lab work.  If patient continues to have symptoms, consider trying to get Repatha approved by insurance. Monitor BP outside the office. Follow-up with neurology in 2 days as planned. Return in about 6 months (around 08/15/2024).

## 2024-02-15 ENCOUNTER — Ambulatory Visit (INDEPENDENT_AMBULATORY_CARE_PROVIDER_SITE_OTHER): Payer: 59 | Admitting: Neurology

## 2024-02-15 ENCOUNTER — Encounter: Payer: Self-pay | Admitting: Neurology

## 2024-02-15 VITALS — BP 155/84 | HR 88 | Ht 65.0 in | Wt 221.0 lb

## 2024-02-15 DIAGNOSIS — R55 Syncope and collapse: Secondary | ICD-10-CM | POA: Diagnosis not present

## 2024-02-15 DIAGNOSIS — I6601 Occlusion and stenosis of right middle cerebral artery: Secondary | ICD-10-CM

## 2024-02-15 DIAGNOSIS — E7849 Other hyperlipidemia: Secondary | ICD-10-CM | POA: Diagnosis not present

## 2024-02-15 MED ORDER — ATORVASTATIN CALCIUM 80 MG PO TABS: 80.0000 mg | ORAL_TABLET | Freq: Every day | ORAL | 3 refills | Status: DC

## 2024-02-15 NOTE — Patient Instructions (Signed)
 I had a long d/w patient about her recent syncopal episodes, silent lacunar stroke and asymptomatic middle cerebral artery stenosis, risk for recurrent stroke/TIAs, personally independently reviewed imaging studies and stroke evaluation results and answered questions.Continue aspirin 81 mg daily  for secondary stroke prevention and maintain strict control of hypertension with blood pressure goal below 130/90, diabetes with hemoglobin A1c goal below 6.5% and lipids with LDL cholesterol goal below 70 mg/dL. I also advised the patient to eat a healthy diet with plenty of whole grains, cereals, fruits and vegetables, exercise regularly and maintain ideal body weight .I recommend she increase the dose of Lipitor to 80 mg daily as recent lipid profile still showed suboptimal LDL level.  Continue conservative follow-up for her moderate MCA stenosis and if she has recurrent right hemispheric ischemic symptoms may consider more aggressive treatment in the future.  Followup in the future with me in 6 months or call earlier if necessary.  Stroke Prevention Some medical conditions and behaviors can lead to a higher chance of having a stroke. You can help prevent a stroke by eating healthy, exercising, not smoking, and managing any medical conditions you have. Stroke is a leading cause of functional impairment. Primary prevention is particularly important because a majority of strokes are first-time events. Stroke changes the lives of not only those who experience a stroke but also their family and other caregivers. How can this condition affect me? A stroke is a medical emergency and should be treated right away. A stroke can lead to brain damage and can sometimes be life-threatening. If a person gets medical treatment right away, there is a better chance of surviving and recovering from a stroke. What can increase my risk? The following medical conditions may increase your risk of a stroke: Cardiovascular  disease. High blood pressure (hypertension). Diabetes. High cholesterol. Sickle cell disease. Blood clotting disorders (hypercoagulable state). Obesity. Sleep disorders (obstructive sleep apnea). Other risk factors include: Being older than age 2. Having a history of blood clots, stroke, or mini-stroke (transient ischemic attack, TIA). Genetic factors, such as race, ethnicity, or a family history of stroke. Smoking cigarettes or using other tobacco products. Taking birth control pills, especially if you also use tobacco. Heavy use of alcohol or drugs, especially cocaine and methamphetamine. Physical inactivity. What actions can I take to prevent this? Manage your health conditions High cholesterol levels. Eating a healthy diet is important for preventing high cholesterol. If cholesterol cannot be managed through diet alone, you may need to take medicines. Take any prescribed medicines to control your cholesterol as told by your health care provider. Hypertension. To reduce your risk of stroke, try to keep your blood pressure below 130/80. Eating a healthy diet and exercising regularly are important for controlling blood pressure. If these steps are not enough to manage your blood pressure, you may need to take medicines. Take any prescribed medicines to control hypertension as told by your health care provider. Ask your health care provider if you should monitor your blood pressure at home. Have your blood pressure checked every year, even if your blood pressure is normal. Blood pressure increases with age and some medical conditions. Diabetes. Eating a healthy diet and exercising regularly are important parts of managing your blood sugar (glucose). If your blood sugar cannot be managed through diet and exercise, you may need to take medicines. Take any prescribed medicines to control your diabetes as told by your health care provider. Get evaluated for obstructive sleep apnea. Talk to  your  health care provider about getting a sleep evaluation if you snore a lot or have excessive sleepiness. Make sure that any other medical conditions you have, such as atrial fibrillation or atherosclerosis, are managed. Nutrition Follow instructions from your health care provider about what to eat or drink to help manage your health condition. These instructions may include: Reducing your daily calorie intake. Limiting how much salt (sodium) you use to 1,500 milligrams (mg) each day. Using only healthy fats for cooking, such as olive oil, canola oil, or sunflower oil. Eating healthy foods. You can do this by: Choosing foods that are high in fiber, such as whole grains, and fresh fruits and vegetables. Eating at least 5 servings of fruits and vegetables a day. Try to fill one-half of your plate with fruits and vegetables at each meal. Choosing lean protein foods, such as lean cuts of meat, poultry without skin, fish, tofu, beans, and nuts. Eating low-fat dairy products. Avoiding foods that are high in sodium. This can help lower blood pressure. Avoiding foods that have saturated fat, trans fat, and cholesterol. This can help prevent high cholesterol. Avoiding processed and prepared foods. Counting your daily carbohydrate intake.  Lifestyle If you drink alcohol: Limit how much you have to: 0-1 drink a day for women who are not pregnant. 0-2 drinks a day for men. Know how much alcohol is in your drink. In the [place] one drink equals one 12 oz bottle of beer ( ), one 5 oz glass of wine ( ), or one 1 oz glass of hard liquor (44mL). Do not use any products that contain nicotine or tobacco. These products include cigarettes, chewing tobacco, and vaping devices, such as e-cigarettes. If you need help quitting, ask your health care provider. Avoid secondhand smoke. Do not use drugs. Activity  Try to stay at a healthy weight. Get at least 30 minutes of exercise on most days, such  as: Fast walking. Biking. Swimming. Medicines Take over-the-counter and prescription medicines only as told by your health care provider. Aspirin or blood thinners (antiplatelets or anticoagulants) may be recommended to reduce your risk of forming blood clots that can lead to stroke. Avoid taking birth control pills. Talk to your health care provider about the risks of taking birth control pills if: You are over 71 years old. You smoke. You get very bad headaches. You have had a blood clot. Where to find more information American Stroke Association: www.strokeassociation.org Get help right away if: You or a loved one has any symptoms of a stroke. "BE FAST" is an easy way to remember the main warning signs of a stroke: B - Balance. Signs are dizziness, sudden trouble walking, or loss of balance. E - Eyes. Signs are trouble seeing or a sudden change in vision. F - Face. Signs are sudden weakness or numbness of the face, or the face or eyelid drooping on one side. A - Arms. Signs are weakness or numbness in an arm. This happens suddenly and usually on one side of the body. S - Speech. Signs are sudden trouble speaking, slurred speech, or trouble understanding what people say. T - Time. Time to call emergency services. Write down what time symptoms started. You or a loved one has other signs of a stroke, such as: A sudden, severe headache with no known cause. Nausea or vomiting. Seizure. These symptoms may represent a serious problem that is an emergency. Do not wait to see if the symptoms will go away. Get medical help right away. Call your  local emergency services (911 in the [place] Do not drive yourself to the hospital. Summary You can help to prevent a stroke by eating healthy, exercising, not smoking, limiting alcohol intake, and managing any medical conditions you may have. Do not use any products that contain nicotine or tobacco. These include cigarettes, chewing tobacco, and vaping  devices, such as e-cigarettes. If you need help quitting, ask your health care provider. Remember "BE FAST" for warning signs of a stroke. Get help right away if you or a loved one has any of these signs. This information is not intended to replace advice given to you by your health care provider. Make sure you discuss any questions you have with your health care provider. Document Revised: 11/01/2022 Document Reviewed: 11/01/2022 Elsevier Patient Education  2024 [employer].

## 2024-02-15 NOTE — Progress Notes (Signed)
 [facility] [address] [place]. [place] [address] [phone]       OFFICE CONSULT NOTE  [name] Date of Birth:  1966/04/06 Medical Record Number:  [id number]   Referring MD: [name]  Reason for Referral: Syncope and stroke  HPI: [name] is a 58 year old Caucasian lady seen today for initial office consultation visit.  History is obtained from the patient and review of electronic medical records and I personally reviewed pertinent available imaging films in PACS.  Patient presented on 11/28/2023 for evaluation for disorder of brief loss of consciousness.  She states she was at her baseline in the morning and then went to work.  There is she started feeling unwell and felt nauseous and lightheaded.  When she stood up she noticed black spots in front of her vision and then subsequently lost consciousness for few seconds.  She returned quickly back to baseline.  She did not have any confusion, tongue bite, incontinence or sensation of hot and cold in her feet.  When EMS evaluated her vital signs were okay.  She quickly returned back to her baseline.  After evaluation in the ER a CT scan was obtained which showed no acute abnormality and CT angiogram however showed severe focal stenosis of the right M1 segment of the middle cerebral artery and there was a mild soft plaque at the right carotid bifurcation.  MRI scan of the brain was obtained which showed a punctate acute infarct in the periatrial white matter of the left lateral ventricle and confirmed loss of flow signal in the right M1.  Patient was felt to have a syncopal event and incidental lacunar infarct from small vessel disease.  Patient was referred to [name] who did an outpatient cerebral catheter angiogram on 12/29/2023 which showed however only 50% right M1 stenosis and medical management was recommended.  Echocardiogram showed ejection fraction 75%.  Left atrial size was normal.  Patient was  placed on aspirin and Lipitor and she is tolerating both medications well without side effects.  Follow-up lipid profile done yesterday on 02/13/2024 showed LDL-cholesterol to be 91 mg percent.  Hemoglobin A1c was 5.3 on 11/28/2023.  Patient has had no further episodes of passing out or any other focal neurological symptoms.  She does have history of mild sleep apnea and stenosis but was advised to lose weight was not felt to be candidate for CPAP.  There is no family history of stroke or TIA.  She did wear a 30-day heart monitor which showed no evidence of any paroxysmal A-fib.  She has no new complaints today.  ROS:   14 system review of systems is positive for syncope, lightheadedness, nausea, passing out, snoring all other systems negative  PMH:  Past Medical History:  Diagnosis Date   GERD (gastroesophageal reflux disease)    Hypothyroidism    IBS (irritable bowel syndrome)    S/P colonoscopy 07/13/2010   [place]: no results available. Reportedly done because heme +    Social History:  Social History   Socioeconomic History   Marital status: Married    Spouse name: Not on file   Number of children: 2   Years of education: Not on file   Highest education level: Bachelor's degree (e.g., BA, AB, BS)  Occupational History   Occupation: Community education officer: [employer]    Comment: [place], [place]  Tobacco Use   Smoking status: Never   Smokeless tobacco: Never  Vaping Use  Vaping status: Never Used  Substance and Sexual Activity   Alcohol use: No    Alcohol/week: 0.0 standard drinks of alcohol   Drug use: No   Sexual activity: Yes    Partners: Male    Birth control/protection: None    Comment: spouse  Other Topics Concern   Not on file  Social History Narrative   Not on file   Social Drivers of Health   Financial Resource Strain: Low Risk  (12/20/2023)   Overall Financial Resource Strain (CARDIA)    Difficulty of Paying Living Expenses: Not hard at all  Food Insecurity:  No Food Insecurity (12/20/2023)   Hunger Vital Sign    Worried About Running Out of Food in the Last Year: Never true    Ran Out of Food in the Last Year: Never true  Transportation Needs: No Transportation Needs (12/20/2023)   PRAPARE - Administrator, Civil Service (Medical): No    Lack of Transportation (Non-Medical): No  Physical Activity: Insufficiently Active (12/20/2023)   Exercise Vital Sign    Days of Exercise per Week: 4 days    Minutes of Exercise per Session: 20 min  Stress: Stress Concern Present (12/20/2023)   [employer] of Occupational Health - Occupational Stress Questionnaire    Feeling of Stress : To some extent  Social Connections: Unknown (12/20/2023)   Social Connection and Isolation Panel [NHANES]    Frequency of Communication with Friends and Family: Patient declined    Frequency of Social Gatherings with Friends and Family: Patient declined    Attends Religious Services: Patient declined    Database administrator or Organizations: Patient declined    Attends Banker Meetings: Not on file    Marital Status: Married  Intimate Partner Violence: Not At Risk (11/28/2023)   Humiliation, Afraid, Rape, and Kick questionnaire    Fear of Current or Ex-Partner: No    Emotionally Abused: No    Physically Abused: No    Sexually Abused: No    Medications:   Current Outpatient Medications on File Prior to Visit  Medication Sig Dispense Refill   acetaminophen (TYLENOL) 325 MG tablet Take 2 tablets (650 mg total) by mouth every 6 (six) hours as needed for mild pain (pain score 1-3), fever or headache (or Fever >/= 100.4).     ALPRAZolam (XANAX) 0.5 MG tablet Take 0.5 mg by mouth as needed for anxiety.     aspirin EC 81 MG tablet Take 1 tablet (81 mg total) by mouth daily with breakfast. take Aspirin 81 mg daily along with Plavix 75 mg daily for 21 days then after that STOP the Plavix  and continue ONLY Aspirin 81 mg daily indefinitely--for secondary  stroke Prevention 100 tablet 3   atorvastatin (LIPITOR) 40 MG tablet Take 1 tablet (40 mg total) by mouth daily. For stroke prevention 30 tablet 11   famotidine (PEPCID) 20 MG tablet Take 1 tablet (20 mg total) by mouth 2 (two) times daily. (Patient taking differently: Take 20 mg by mouth as needed.) 30 tablet 0   levothyroxine (SYNTHROID) 88 MCG tablet Take 1 tablet (88 mcg total) by mouth daily before breakfast. 90 tablet 2   losartan (COZAAR) 25 MG tablet Take 1 tablet (25 mg total) by mouth daily. 30 tablet 3   pantoprazole (PROTONIX) 20 MG tablet Take 20 mg by mouth daily.     No current facility-administered medications on file prior to visit.    Allergies:   Allergies  Allergen Reactions   Erythromycin Hives   Amoxil [Amoxicillin] Rash    Rash on palms of hands; peeling of hands     Physical Exam General: Mildly obese pleasant middle-age Caucasian lady, seated, in no evident distress Head: head normocephalic and atraumatic.   Neck: supple with no carotid or supraclavicular bruits Cardiovascular: regular rate and rhythm, no murmurs Musculoskeletal: no deformity Skin:  no rash/petichiae Vascular:  Normal pulses all extremities  Neurologic Exam Mental Status: Awake and fully alert. Oriented to place and time. Recent and remote memory intact. Attention span, concentration and fund of knowledge appropriate. Mood and affect appropriate.  Cranial Nerves: Fundoscopic exam reveals sharp disc margins. Pupils equal, briskly reactive to light. Extraocular movements full without nystagmus. Visual fields full to confrontation. Hearing intact. Facial sensation intact. Face, tongue, palate moves normally and symmetrically.  Motor: Normal bulk and tone. Normal strength in all tested extremity muscles. Sensory.: intact to touch , pinprick , position and vibratory sensation.  Coordination: Rapid alternating movements normal in all extremities. Finger-to-nose and heel-to-shin performed accurately  bilaterally. Gait and Station: Arises from chair without difficulty. Stance is normal. Gait demonstrates normal stride length and balance . Able to heel, toe and tandem walk with mild difficulty.  Reflexes: 1+ and symmetric. Toes downgoing.   NIHSS  0 Modified Rankin  0   ASSESSMENT: 58 year old Caucasian lady with syncopal episode in December 2024 with MRI showing silent tiny lacunar periventricular subcortical infarct as well as asymptomatic moderate right MCA stenosis.  Vascular risk factors of hyper lipidemia mild obesity, intracranial stenosis and mild sleep apnea.     PLAN: I had a long d/w patient about her recent syncopal episodes, silent lacunar stroke and asymptomatic middle cerebral artery stenosis, risk for recurrent stroke/TIAs, personally independently reviewed imaging studies and stroke evaluation results and answered questions.Continue aspirin 81 mg daily  for secondary stroke prevention and maintain strict control of hypertension with blood pressure goal below 130/90, diabetes with hemoglobin A1c goal below 6.5% and lipids with LDL cholesterol goal below 70 mg/dL. I also advised the patient to eat a healthy diet with plenty of whole grains, cereals, fruits and vegetables, exercise regularly and maintain ideal body weight .I recommend she increase the dose of Lipitor to 80 mg daily as recent lipid profile still showed suboptimal LDL level.  Continue conservative follow-up for her moderate MCA stenosis and if she has recurrent right hemispheric ischemic symptoms may consider more aggressive treatment in the future.  Followup in the future with me in 6 months or call earlier if necessary.  Greater than 50% time during this 50-minute visit were spent on counseling and coordination of care about the episode of syncope, silent intracranial stenosis and lacunar disease and discussion about stroke risk and answering questions  Delia Heady, MD Note: This document was prepared with digital  dictation and possible smart phrase technology. Any transcriptional errors that result from this process are unintentional.

## 2024-03-19 ENCOUNTER — Ambulatory Visit: Payer: 59 | Admitting: Family Medicine

## 2024-04-06 ENCOUNTER — Other Ambulatory Visit: Payer: Self-pay | Admitting: Family Medicine

## 2024-04-06 DIAGNOSIS — I10 Essential (primary) hypertension: Secondary | ICD-10-CM

## 2024-04-18 ENCOUNTER — Ambulatory Visit: Admitting: Family Medicine

## 2024-04-18 ENCOUNTER — Encounter: Payer: Self-pay | Admitting: Family Medicine

## 2024-04-18 VITALS — BP 144/87 | HR 95 | Temp 98.4°F | Ht 65.0 in | Wt 214.0 lb

## 2024-04-18 DIAGNOSIS — E785 Hyperlipidemia, unspecified: Secondary | ICD-10-CM | POA: Diagnosis not present

## 2024-04-18 DIAGNOSIS — I10 Essential (primary) hypertension: Secondary | ICD-10-CM

## 2024-04-18 DIAGNOSIS — R42 Dizziness and giddiness: Secondary | ICD-10-CM | POA: Diagnosis not present

## 2024-04-18 MED ORDER — LOSARTAN POTASSIUM 50 MG PO TABS: 50.0000 mg | ORAL_TABLET | Freq: Every day | ORAL | 3 refills | Status: AC

## 2024-04-18 MED ORDER — MECLIZINE HCL 25 MG PO TABS: 25.0000 mg | ORAL_TABLET | Freq: Three times a day (TID) | ORAL | 0 refills | Status: AC | PRN

## 2024-04-18 MED ORDER — REPATHA SURECLICK 140 MG/ML ~~LOC~~ SOAJ: 140.0000 mg | SUBCUTANEOUS | 1 refills | Status: DC

## 2024-04-18 NOTE — Progress Notes (Signed)
 Subjective:  Patient ID: [name], female    DOB: [birth year]  Age: 58 y.o. MRN: [id number]  CC:   Chief Complaint  Patient presents with   Dizziness    Off and on for about a week up to today has been feeling off balance, but today felt room spinning upon standing, hx of stroke and htn     HPI:  58 year old female with below mentioned medical problems presents for evaluation of the above  Patient reports a 1 week history of intermittent dizziness.  Described as a spinning sensation.  Feels like vertigo.  Has had vertigo in the past.  Seems to be worse today.  She states that this tends to occur this time of the year when she has issues with allergies.  She is not currently taking an antihistamine.  Will discuss today.  Additionally, patient's blood pressure is elevated today.  She is compliant with losartan  25 mg.  Will discuss increase in dose.  Additionally, lipids are not at goal.  Crestor was recently increased to 80 mg.  Patient states that she does not feel like she is tolerating this dose due to myalgias.  Will discuss additional treatment options to get LDL below 70.  Patient Active Problem List   Diagnosis Date Noted   Dizziness 04/18/2024   Hyperlipidemia 02/13/2024   Middle cerebral artery stenosis/Severe focal stenosis in the M1 segment of Rt MCA 11/29/2023   Obesity (BMI 30-39.9) 11/29/2023   Acute CVA/Punctate acute infarct in the periatrial white matter on the left. 11/28/2023   Primary hypertension 11/28/2023   Hepatic steatosis 02/14/2023   Aortic atherosclerosis (HCC) 02/14/2023   Sleep apnea 02/14/2023   Irritable bowel syndrome with diarrhea 02/11/2023   Acute gastritis without hemorrhage 11/15/2022   Migraine 07/14/2020   Prediabetes 06/24/2017   Hypothyroidism 04/01/2016   GERD (gastroesophageal reflux disease) 03/10/2013    Social Hx   Social History   Socioeconomic History   Marital status: Married    Spouse name: Not on file   Number of  children: 2   Years of education: Not on file   Highest education level: Bachelor's degree (e.g., BA, AB, BS)  Occupational History   Occupation: Community education officer: [employer]    Comment: [place], [place]  Tobacco Use   Smoking status: Never   Smokeless tobacco: Never  Vaping Use   Vaping status: Never Used  Substance and Sexual Activity   Alcohol use: No    Alcohol/week: 0.0 standard drinks of alcohol   Drug use: No   Sexual activity: Yes    Partners: Male    Birth control/protection: None    Comment: spouse  Other Topics Concern   Not on file  Social History Narrative   Not on file   Social Drivers of Health   Financial Resource Strain: Low Risk  (12/20/2023)   Overall Financial Resource Strain (CARDIA)    Difficulty of Paying Living Expenses: Not hard at all  Food Insecurity: No Food Insecurity (12/20/2023)   Hunger Vital Sign    Worried About Running Out of Food in the Last Year: Never true    Ran Out of Food in the Last Year: Never true  Transportation Needs: No Transportation Needs (12/20/2023)   PRAPARE - Administrator, Civil Service (Medical): No    Lack of Transportation (Non-Medical): No  Physical Activity: Insufficiently Active (12/20/2023)   Exercise Vital Sign    Days of Exercise per Week: 4  days    Minutes of Exercise per Session: 20 min  Stress: Stress Concern Present (12/20/2023)   [employer] of Occupational Health - Occupational Stress Questionnaire    Feeling of Stress : To some extent  Social Connections: Unknown (12/20/2023)   Social Connection and Isolation Panel [NHANES]    Frequency of Communication with Friends and Family: Patient declined    Frequency of Social Gatherings with Friends and Family: Patient declined    Attends Religious Services: Patient declined    Database administrator or Organizations: Patient declined    Attends Engineer, structural: Not on file    Marital Status: Married    Review of Systems Per  HPI  Objective:  BP (!) 144/87   Pulse 95   Temp 98.4 F (36.9 C)   Ht 5\' 5"  (1.651 m)   Wt 214 lb (97.1 kg)   LMP 01/11/2016   SpO2 99%   BMI 35.61 kg/m      04/18/2024   11:29 AM 04/18/2024   11:22 AM 02/15/2024    8:54 AM  BP/Weight  Systolic BP 144 160 155  Diastolic BP 87 90 84  Wt. (Lbs)  214 221  BMI  35.61 kg/m2 36.78 kg/m2    Physical Exam Vitals and nursing note reviewed.  Constitutional:      General: She is not in acute distress.    Appearance: Normal appearance.  Eyes:     General:        Right eye: No discharge.        Left eye: No discharge.     Conjunctiva/sclera: Conjunctivae normal.  Cardiovascular:     Rate and Rhythm: Normal rate and regular rhythm.  Pulmonary:     Effort: Pulmonary effort is normal.     Breath sounds: Normal breath sounds. No wheezing, rhonchi or rales.  Neurological:     Mental Status: She is alert.     Lab Results  Component Value Date   WBC 7.2 12/29/2023   HGB 13.3 12/29/2023   HCT 41.4 12/29/2023   PLT 261 12/29/2023   GLUCOSE 107 (H) 12/29/2023   CHOL 172 02/13/2024   TRIG 136 02/13/2024   HDL 57 02/13/2024   LDLCALC 91 02/13/2024   ALT 32 02/13/2024   AST 29 02/13/2024   NA 141 12/29/2023   K 3.5 12/29/2023   CL 103 12/29/2023   CREATININE 1.06 (H) 12/29/2023   BUN 13 12/29/2023   CO2 25 12/29/2023   TSH 2.020 01/11/2024   INR 1.1 12/29/2023   HGBA1C 5.3 11/28/2023     Assessment & Plan:  Dizziness Assessment & Plan: Secondary to vertigo.  Meclizine as directed.  Advised use of Patton antihistamine as well.  Orders: -     Meclizine HCl; Take 1 tablet (25 mg total) by mouth 3 (three) times Patton as needed for dizziness.  Dispense: 30 tablet; Refill: 0  Primary hypertension Assessment & Plan: Not at goal.  Increasing losartan  to 50 mg Patton.  Metabolic panel in 2 weeks.  Orders: -     Losartan  Potassium; Take 1 tablet (50 mg total) by mouth Patton.  Dispense: 90 tablet; Refill: 3 -     Basic  metabolic panel with GFR  Hyperlipidemia, unspecified hyperlipidemia type Assessment & Plan: Not at goal.  Not tolerating max dose Lipitor.  Adding Repatha.  Goal is less than 70 in the setting of prior stroke.  Advised patient to decrease Lipitor to 40 mg.  Orders: -  Repatha SureClick; Inject 140 mg into the skin every 14 (fourteen) days.  Dispense: 6 mL; Refill: 1    Follow-up:  No follow-ups on file.  Kathleen Papa DO [facility]

## 2024-04-18 NOTE — Assessment & Plan Note (Signed)
 Not at goal.  Not tolerating max dose Lipitor.  Adding Repatha.  Goal is less than 70 in the setting of prior stroke.  Advised patient to decrease Lipitor to 40 mg.

## 2024-04-18 NOTE — Assessment & Plan Note (Signed)
 Not at goal.  Increasing losartan  to 50 mg daily.  Metabolic panel in 2 weeks.

## 2024-04-18 NOTE — Patient Instructions (Addendum)
 Medications as prescribed.  Lab in 2 weeks.  Follow up in 3 months.

## 2024-04-18 NOTE — Assessment & Plan Note (Signed)
 Secondary to vertigo.  Meclizine as directed.  Advised use of daily antihistamine as well.

## 2024-04-19 ENCOUNTER — Other Ambulatory Visit (HOSPITAL_COMMUNITY): Payer: Self-pay

## 2024-04-19 ENCOUNTER — Telehealth: Payer: Self-pay | Admitting: Pharmacy Technician

## 2024-04-19 NOTE — Telephone Encounter (Signed)
 Pharmacy Patient Advocate Encounter   Received notification from CoverMyMeds that prior authorization for Repatha SureClick 140MG /ML auto-injectors is required/requested.   Insurance verification completed.   The patient is insured through [facility] .   Per test claim: PA required; PA submitted to above mentioned insurance via CoverMyMeds Key/confirmation #/EOC [id number] Status is pending

## 2024-04-20 ENCOUNTER — Other Ambulatory Visit (HOSPITAL_COMMUNITY): Payer: Self-pay

## 2024-04-20 ENCOUNTER — Encounter: Payer: Self-pay | Admitting: Family Medicine

## 2024-04-20 NOTE — Telephone Encounter (Signed)
 Pharmacy Patient Advocate Encounter  Received notification from [facility] that Prior Authorization for Repatha  SureClick 140MG /ML auto-injectors has been APPROVED from 04/19/2024 to 04/19/2025. Ran test claim, Copay is $0.00. This test claim was processed through [facility]- copay amounts may vary at other pharmacies due to pharmacy/plan contracts, or as the patient moves through the different stages of their insurance plan.   PA #/Case ID/Reference #: [id number]

## 2024-05-14 ENCOUNTER — Encounter: Payer: Self-pay | Admitting: Family Medicine

## 2024-06-08 ENCOUNTER — Encounter (HOSPITAL_COMMUNITY): Payer: Self-pay | Admitting: Interventional Radiology

## 2024-06-13 ENCOUNTER — Encounter: Payer: Self-pay | Admitting: Family Medicine

## 2024-06-13 ENCOUNTER — Ambulatory Visit: Admitting: Family Medicine

## 2024-06-13 VITALS — BP 167/96 | HR 88 | Temp 97.3°F | Ht 65.0 in | Wt 213.0 lb

## 2024-06-13 DIAGNOSIS — M7918 Myalgia, other site: Secondary | ICD-10-CM | POA: Diagnosis not present

## 2024-06-13 MED ORDER — MELOXICAM 15 MG PO TABS: 15.0000 mg | ORAL_TABLET | Freq: Every day | ORAL | 0 refills | Status: AC | PRN

## 2024-06-13 NOTE — Patient Instructions (Signed)
 Rest.   Heat to the back.  Medication as directed.  Take care  Dr. Bluford

## 2024-06-14 DIAGNOSIS — M7918 Myalgia, other site: Secondary | ICD-10-CM | POA: Insufficient documentation

## 2024-06-14 NOTE — Progress Notes (Signed)
 Subjective:  Patient ID: [name], female    DOB: 18-Jun-1966  Age: 58 y.o. MRN: [id number]  CC:   Chief Complaint  Patient presents with   Back Pain    Mid back pain x's 1 week. Has tried OTC ibuprofen, ice and heat   Knee Pain    Right knee pain x's 1 week Has tried OTC ibuprofen, ice and heat    HPI:  58 year old female presents for evaluation of the above.  1 week history of mid back pain as well as right knee pain.  No fall or direct injury.  She does note that she has been exercising more regularly and recently started to jump roping.  She recently exercise and then developed pain afterwards.  She has used ibuprofen, ice, and heat without resolution.  Patient Active Problem List   Diagnosis Date Noted   Musculoskeletal pain 06/14/2024   Hyperlipidemia 02/13/2024   Middle cerebral artery stenosis/Severe focal stenosis in the M1 segment of Rt MCA 11/29/2023   Obesity (BMI 30-39.9) 11/29/2023   Acute CVA/Punctate acute infarct in the periatrial white matter on the left. 11/28/2023   Primary hypertension 11/28/2023   Hepatic steatosis 02/14/2023   Aortic atherosclerosis (HCC) 02/14/2023   Sleep apnea 02/14/2023   Irritable bowel syndrome with diarrhea 02/11/2023   Migraine 07/14/2020   Prediabetes 06/24/2017   Hypothyroidism 04/01/2016   GERD (gastroesophageal reflux disease) 03/10/2013    Social Hx   Social History   Socioeconomic History   Marital status: Married    Spouse name: Not on file   Number of children: 2   Years of education: Not on file   Highest education level: Bachelor's degree (e.g., BA, AB, BS)  Occupational History   Occupation: Community education officer: [employer]    Comment: [place], [place]  Tobacco Use   Smoking status: Never   Smokeless tobacco: Never  Vaping Use   Vaping status: Never Used  Substance and Sexual Activity   Alcohol use: No    Alcohol/week: 0.0 standard drinks of alcohol   Drug use: No   Sexual activity: Yes     Partners: Male    Birth control/protection: None    Comment: spouse  Other Topics Concern   Not on file  Social History Narrative   Not on file   Social Drivers of Health   Financial Resource Strain: Low Risk  (12/20/2023)   Overall Financial Resource Strain (CARDIA)    Difficulty of Paying Living Expenses: Not hard at all  Food Insecurity: No Food Insecurity (12/20/2023)   Hunger Vital Sign    Worried About Running Out of Food in the Last Year: Never true    Ran Out of Food in the Last Year: Never true  Transportation Needs: No Transportation Needs (12/20/2023)   PRAPARE - Administrator, Civil Service (Medical): No    Lack of Transportation (Non-Medical): No  Physical Activity: Insufficiently Active (12/20/2023)   Exercise Vital Sign    Days of Exercise per Week: 4 days    Minutes of Exercise per Session: 20 min  Stress: Stress Concern Present (12/20/2023)   [employer] of Occupational Health - Occupational Stress Questionnaire    Feeling of Stress : To some extent  Social Connections: Unknown (12/20/2023)   Social Connection and Isolation Panel    Frequency of Communication with Friends and Family: Patient declined    Frequency of Social Gatherings with Friends and Family: Patient declined  Attends Religious Services: Patient declined    Active Member of Clubs or Organizations: Patient declined    Attends Banker Meetings: Not on file    Marital Status: Married    Review of Systems Per HPI  Objective:  BP (!) 167/96   Pulse 88   Temp (!) 97.3 F (36.3 C)   Ht 5' 5 (1.651 m)   Wt 213 lb (96.6 kg)   LMP 01/11/2016   SpO2 99%   BMI 35.45 kg/m      06/13/2024   10:25 AM 04/18/2024   11:29 AM 04/18/2024   11:22 AM  BP/Weight  Systolic BP 167 144 160  Diastolic BP 96 87 90  Wt. (Lbs) 213  214  BMI 35.45 kg/m2  35.61 kg/m2    Physical Exam Vitals and nursing note reviewed.  Constitutional:      General: She is not in acute distress.     Appearance: Normal appearance.  HENT:     Head: Normocephalic and atraumatic.  Cardiovascular:     Rate and Rhythm: Normal rate and regular rhythm.  Pulmonary:     Effort: Pulmonary effort is normal.     Breath sounds: Normal breath sounds. No wheezing, rhonchi or rales.  Musculoskeletal:     Comments: Negative exam of the right knee.  Neurological:     Mental Status: She is alert.  Psychiatric:        Mood and Affect: Mood normal.        Behavior: Behavior normal.     Lab Results  Component Value Date   WBC 7.2 12/29/2023   HGB 13.3 12/29/2023   HCT 41.4 12/29/2023   PLT 261 12/29/2023   GLUCOSE 107 (H) 12/29/2023   CHOL 172 02/13/2024   TRIG 136 02/13/2024   HDL 57 02/13/2024   LDLCALC 91 02/13/2024   ALT 32 02/13/2024   AST 29 02/13/2024   NA 141 12/29/2023   K 3.5 12/29/2023   CL 103 12/29/2023   CREATININE 1.06 (H) 12/29/2023   BUN 13 12/29/2023   CO2 25 12/29/2023   TSH 2.020 01/11/2024   INR 1.1 12/29/2023   HGBA1C 5.3 11/28/2023     Assessment & Plan:  Musculoskeletal pain Assessment & Plan: Rest, heat.  Meloxicam as directed.   Other orders -     Meloxicam; Take 1 tablet (15 mg total) by mouth daily as needed for pain.  Dispense: 14 tablet; Refill: 0    Follow-up:  Return if symptoms worsen or fail to improve.  Jacqulyn Ahle DO [facility]

## 2024-06-14 NOTE — Assessment & Plan Note (Signed)
 Rest, heat.  Meloxicam as directed.

## 2024-06-29 ENCOUNTER — Ambulatory Visit (HOSPITAL_COMMUNITY)
Admission: RE | Admit: 2024-06-29 | Discharge: 2024-06-29 | Disposition: A | Discharge status: Home / Self Care | Source: Ambulatory Visit | Attending: Family Medicine | Admitting: Family Medicine

## 2024-06-29 ENCOUNTER — Ambulatory Visit: Admitting: Family Medicine

## 2024-06-29 ENCOUNTER — Encounter: Payer: Self-pay | Admitting: Family Medicine

## 2024-06-29 ENCOUNTER — Other Ambulatory Visit: Payer: Self-pay | Admitting: Family Medicine

## 2024-06-29 VITALS — BP 140/86 | HR 95 | Temp 98.1°F | Ht 65.0 in | Wt 211.0 lb

## 2024-06-29 DIAGNOSIS — R1031 Right lower quadrant pain: Secondary | ICD-10-CM | POA: Diagnosis present

## 2024-06-29 MED ORDER — TRAMADOL HCL 50 MG PO TABS: 50.0000 mg | ORAL_TABLET | Freq: Three times a day (TID) | ORAL | 0 refills | Status: DC | PRN

## 2024-06-29 NOTE — Patient Instructions (Signed)
 We will call with the results once the CT returns.  Take care  Dr. Bluford

## 2024-07-01 DIAGNOSIS — R1031 Right lower quadrant pain: Secondary | ICD-10-CM | POA: Insufficient documentation

## 2024-07-01 NOTE — Assessment & Plan Note (Signed)
 CT was obtained and was negative. ? MSK. Tramadol  as needed. Supportive care.

## 2024-07-01 NOTE — Progress Notes (Signed)
 Subjective:  Patient ID: [name], female    DOB: 21-Mar-1966  Age: 58 y.o. MRN: [id number]  CC:   Chief Complaint  Patient presents with   RLQ abd pain 6/10 w/o alever    Going on since Wednesday this week  Dull ache better w/ laying back . No other symptoms    HPI:  58 year old female presents for evaluation of the above.  Started Wednesday. Reports RLQ pain. Crampy/achy in character. Constant. Better with rest and Advil. No change in bowel habits. No fever. No other associated symptoms.   Patient Active Problem List   Diagnosis Date Noted   Right lower quadrant abdominal pain 07/01/2024   Musculoskeletal pain 06/14/2024   Hyperlipidemia 02/13/2024   Middle cerebral artery stenosis/Severe focal stenosis in the M1 segment of Rt MCA 11/29/2023   Obesity (BMI 30-39.9) 11/29/2023   Acute CVA/Punctate acute infarct in the periatrial white matter on the left. 11/28/2023   Primary hypertension 11/28/2023   Hepatic steatosis 02/14/2023   Aortic atherosclerosis (HCC) 02/14/2023   Sleep apnea 02/14/2023   Irritable bowel syndrome with diarrhea 02/11/2023   Migraine 07/14/2020   Prediabetes 06/24/2017   Hypothyroidism 04/01/2016   GERD (gastroesophageal reflux disease) 03/10/2013    Social Hx   Social History   Socioeconomic History   Marital status: Married    Spouse name: Not on file   Number of children: 2   Years of education: Not on file   Highest education level: Bachelor's degree (e.g., BA, AB, BS)  Occupational History   Occupation: Community education officer: [employer]    Comment: [place], [place]  Tobacco Use   Smoking status: Never   Smokeless tobacco: Never  Vaping Use   Vaping status: Never Used  Substance and Sexual Activity   Alcohol use: No    Alcohol/week: 0.0 standard drinks of alcohol   Drug use: No   Sexual activity: Yes    Partners: Male    Birth control/protection: None    Comment: spouse  Other Topics Concern   Not on file  Social History  Narrative   Not on file   Social Drivers of Health   Financial Resource Strain: Low Risk  (12/20/2023)   Overall Financial Resource Strain (CARDIA)    Difficulty of Paying Living Expenses: Not hard at all  Food Insecurity: No Food Insecurity (12/20/2023)   Hunger Vital Sign    Worried About Running Out of Food in the Last Year: Never true    Ran Out of Food in the Last Year: Never true  Transportation Needs: No Transportation Needs (12/20/2023)   PRAPARE - Administrator, Civil Service (Medical): No    Lack of Transportation (Non-Medical): No  Physical Activity: Insufficiently Active (12/20/2023)   Exercise Vital Sign    Days of Exercise per Week: 4 days    Minutes of Exercise per Session: 20 min  Stress: Stress Concern Present (12/20/2023)   [employer] of Occupational Health - Occupational Stress Questionnaire    Feeling of Stress : To some extent  Social Connections: Unknown (12/20/2023)   Social Connection and Isolation Panel    Frequency of Communication with Friends and Family: Patient declined    Frequency of Social Gatherings with Friends and Family: Patient declined    Attends Religious Services: Patient declined    Database administrator or Organizations: Patient declined    Attends Banker Meetings: Not on file    Marital  Status: Married    Review of Systems Per HPI  Objective:  BP (!) 140/86   Pulse 95   Temp 98.1 F (36.7 C)   Ht 5' 5 (1.651 m)   Wt 211 lb (95.7 kg)   LMP 01/11/2016   SpO2 100%   BMI 35.11 kg/m      06/29/2024   10:50 AM 06/13/2024   10:25 AM 04/18/2024   11:29 AM  BP/Weight  Systolic BP 140 167 144  Diastolic BP 86 96 87  Wt. (Lbs) 211 213   BMI 35.11 kg/m2 35.45 kg/m2     Physical Exam Constitutional:      General: She is not in acute distress. HENT:     Head: Normocephalic and atraumatic.  Cardiovascular:     Rate and Rhythm: Normal rate and regular rhythm.  Pulmonary:     Effort: Pulmonary effort is  normal.     Breath sounds: Normal breath sounds.  Abdominal:     General: There is no distension.     Palpations: Abdomen is soft.     Comments: + RLQ tenderness.  Neurological:     Mental Status: She is alert.     Lab Results  Component Value Date   WBC 7.2 12/29/2023   HGB 13.3 12/29/2023   HCT 41.4 12/29/2023   PLT 261 12/29/2023   GLUCOSE 107 (H) 12/29/2023   CHOL 172 02/13/2024   TRIG 136 02/13/2024   HDL 57 02/13/2024   LDLCALC 91 02/13/2024   ALT 32 02/13/2024   AST 29 02/13/2024   NA 141 12/29/2023   K 3.5 12/29/2023   CL 103 12/29/2023   CREATININE 1.06 (H) 12/29/2023   BUN 13 12/29/2023   CO2 25 12/29/2023   TSH 2.020 01/11/2024   INR 1.1 12/29/2023   HGBA1C 5.3 11/28/2023     Assessment & Plan:  Right lower quadrant abdominal pain Assessment & Plan: CT was obtained and was negative. ? MSK. Tramadol  as needed. Supportive care.   Orders: -     CT ABDOMEN PELVIS WO CONTRAST    Follow-up:  Return if symptoms worsen or fail to improve.  Jacqulyn Ahle DO [facility]

## 2024-07-03 ENCOUNTER — Other Ambulatory Visit: Payer: Self-pay | Admitting: Gastroenterology

## 2024-08-02 ENCOUNTER — Telehealth (HOSPITAL_COMMUNITY): Payer: Self-pay

## 2024-08-02 ENCOUNTER — Other Ambulatory Visit (HOSPITAL_COMMUNITY): Payer: Self-pay | Admitting: Radiology

## 2024-08-02 DIAGNOSIS — I771 Stricture of artery: Secondary | ICD-10-CM

## 2024-08-02 DIAGNOSIS — I6609 Occlusion and stenosis of unspecified middle cerebral artery: Secondary | ICD-10-CM

## 2024-08-02 NOTE — Telephone Encounter (Signed)
 Returned pt's call, no answer, left vm. AB

## 2024-08-02 NOTE — Telephone Encounter (Signed)
 Pt called regarding her f/u. I informed her that a letter was sent out regarding Dr. Dolphus leaving and offered for her to go see Dr. Lester. She would like for me to put in referral for their office. AB

## 2024-08-09 ENCOUNTER — Encounter: Payer: Self-pay | Admitting: Neuroradiology

## 2024-08-09 ENCOUNTER — Ambulatory Visit: Admitting: Neuroradiology

## 2024-08-09 VITALS — BP 172/123 | HR 106 | Ht 65.0 in | Wt 213.0 lb

## 2024-08-09 DIAGNOSIS — I6601 Occlusion and stenosis of right middle cerebral artery: Secondary | ICD-10-CM

## 2024-08-09 NOTE — Progress Notes (Signed)
 I had the pleasure of seeing [name] in the office today.  Briefly, she had a syncopal episode 11/28/2023 which resulted in a emergency visit where she had a CT, CT arteriogram and MR angiogram on 11/27/2013.  Those reported a small stroke in the left parietal lobe.  I have reviewed the MRI and I do not think there was a stroke.  The vascular imaging demonstrated a unrelated stenosis of the right middle cerebral artery M1 segment.  For the latter she saw Dr. Monna for who did a catheter arteriogram on 12/29/2023, which showed the stenosis to be about 50%.  I have asked her detailed questions about stroke and TIA symptoms, and she is asymptomatic.  I reviewed her medications which include low-dose aspirin , 80 mg atorvastatin  and losartan .  Assessment:  Asymptomatic atherosclerotic right middle cerebral artery stenosis.  Recommendation:  Medical treatment of vascular risk factors.  Low-dose aspirin .  She does not need routine imaging follow-up for this problem unless she develops new symptoms of stroke.

## 2024-08-14 ENCOUNTER — Ambulatory Visit: Admitting: Nurse Practitioner

## 2024-08-14 ENCOUNTER — Encounter: Payer: Self-pay | Admitting: Nurse Practitioner

## 2024-08-14 VITALS — BP 136/84 | HR 85 | Temp 97.5°F | Ht 65.0 in | Wt 214.0 lb

## 2024-08-14 DIAGNOSIS — I10 Essential (primary) hypertension: Secondary | ICD-10-CM | POA: Diagnosis not present

## 2024-08-14 DIAGNOSIS — K2100 Gastro-esophageal reflux disease with esophagitis, without bleeding: Secondary | ICD-10-CM

## 2024-08-14 DIAGNOSIS — R7303 Prediabetes: Secondary | ICD-10-CM

## 2024-08-14 DIAGNOSIS — E038 Other specified hypothyroidism: Secondary | ICD-10-CM

## 2024-08-14 DIAGNOSIS — Z1159 Encounter for screening for other viral diseases: Secondary | ICD-10-CM | POA: Insufficient documentation

## 2024-08-14 DIAGNOSIS — K760 Fatty (change of) liver, not elsewhere classified: Secondary | ICD-10-CM

## 2024-08-14 DIAGNOSIS — E785 Hyperlipidemia, unspecified: Secondary | ICD-10-CM

## 2024-08-14 DIAGNOSIS — F419 Anxiety disorder, unspecified: Secondary | ICD-10-CM

## 2024-08-14 DIAGNOSIS — Z79899 Other long term (current) drug therapy: Secondary | ICD-10-CM

## 2024-08-14 NOTE — Progress Notes (Signed)
 "  Subjective:    Patient ID: [name], female    DOB: October 23, 1966, 58 y.o.   MRN: [id number]  HPI Discussed the use of AI scribe software for clinical note transcription with the patient, who gave verbal consent to proceed.  History of Present Illness [name] is a 58 year old female who presents for a six-month follow-up.  She is currently on Repatha  for cholesterol management, having switched from Lipitor due to adverse effects at the full dosage of 80 mg. She has been taking Lipitor every other day but is considering discontinuing it as Repatha  does not cause discomfort. Her cholesterol levels were excellent at the last check. History of acute CVA December 2024.  She has a history of hypothyroidism and is on 88 mcg of levothyroxine  (Synthroid ) every other day. This adjusted regimen has been in place for about two months, and her thyroid  function was normal at the last check.  She occasionally takes Xanax , cutting the dose in half, and uses it depending on her work and life stressors. She does not take it frequently and may need a refill.  She has experienced weight loss since the beginning of the year and has maintained it through increased physical activity, including aerobics, and an improved diet. She has also reduced her losartan  dose to 25 mg due to her active lifestyle.  She has takes pantoprazole  40 mg daily for acid reflux and uses Pepcid  for breakthrough symptoms. She is has stopped taking tramadol  for pain but has Mobic  available if needed. She reports occasional constipation and has been increasing her water intake to manage it. She is interested in checking her magnesium levels due to her use of Protonix .  She has a history of mild sleep apnea but does not use CPAP. No chest pain, shortness of breath, coughing, or wheezing. She occasionally experiences trouble swallowing, which she attributes to dry throat or reflux, but it is not consistent.     Review of Systems   HENT:  Negative for sore throat and trouble swallowing.   Respiratory:  Negative for cough, chest tightness and shortness of breath.   Cardiovascular:  Negative for chest pain and leg swelling.  Gastrointestinal:  Positive for constipation. Negative for abdominal pain, blood in stool, diarrhea, nausea and vomiting.  Psychiatric/Behavioral:  Negative for sleep disturbance.       08/14/2024    1:13 PM  Depression screen PHQ 2/9  Decreased Interest 0  Down, Depressed, Hopeless 0  PHQ - 2 Score 0  Altered sleeping 0  Tired, decreased energy 0  Change in appetite 0  Feeling bad or failure about yourself  0  Trouble concentrating 0  Moving slowly or fidgety/restless 0  Suicidal thoughts 0  PHQ-9 Score 0  Difficult doing work/chores Not difficult at all      08/14/2024    1:13 PM 06/29/2024   10:54 AM 06/13/2024   10:31 AM 04/18/2024   11:24 AM  GAD 7 : Generalized Anxiety Score  Nervous, Anxious, on Edge 1 1 1 1   Control/stop worrying 0 0 0 0  Worry too much - different things 0 0 0 0  Trouble relaxing 1 0 0 1  Restless 1 0 0 0  Easily annoyed or irritable 0 0 0 0  Afraid - awful might happen 0 0 0 1  Total GAD 7 Score 3 1 1 3   Anxiety Difficulty Not difficult at all Not difficult at all Not difficult at all Somewhat difficult  Social History   Tobacco Use   Smoking status: Never   Smokeless tobacco: Never  Vaping Use   Vaping status: Never Used  Substance Use Topics   Alcohol use: No    Alcohol/week: 0.0 standard drinks of alcohol   Drug use: No        Objective:   Physical Exam Vitals and nursing note reviewed.  Constitutional:      Appearance: She is not toxic-appearing.  Neck:     Comments: Thyroid  nontender to palpation, no mass or goiter noted. Cardiovascular:     Rate and Rhythm: Normal rate and regular rhythm.     Heart sounds: Normal heart sounds.  Pulmonary:     Effort: Pulmonary effort is normal.     Breath sounds: Normal breath sounds.  Abdominal:      General: There is no distension.     Palpations: Abdomen is soft.     Tenderness: There is no abdominal tenderness.  Musculoskeletal:     Cervical back: Neck supple.     Right lower leg: No edema.     Left lower leg: No edema.  Neurological:     Mental Status: She is alert and oriented to person, place, and time.  Psychiatric:        Mood and Affect: Mood normal.        Behavior: Behavior normal.        Thought Content: Thought content normal.        Judgment: Judgment normal.    Today's Vitals   08/14/24 1306  BP: 136/84  Pulse: 85  Temp: (!) 97.5 F (36.4 C)  SpO2: 98%  Weight: 214 lb (97.1 kg)  Height: 5' 5 (1.651 m)   Body mass index is 35.61 kg/m.        Assessment & Plan:   Problem List Items Addressed This Visit       Cardiovascular and Mediastinum   Primary hypertension - Primary   Relevant Orders   Comprehensive metabolic panel with GFR   Lipid panel   Microalbumin / creatinine urine ratio     Digestive   GERD (gastroesophageal reflux disease)   Hepatic steatosis   Relevant Orders   Comprehensive metabolic panel with GFR     Endocrine   Hypothyroidism   Relevant Orders   TSH + free T4     Other   Encounter for hepatitis C screening test for low risk patient   Relevant Orders   Hepatitis C antibody   Hyperlipidemia   Relevant Orders   Lipid panel   Mild anxiety   Relevant Medications   ALPRAZolam  (XANAX ) 0.5 MG tablet   Prediabetes   Relevant Orders   Comprehensive metabolic panel with GFR   Hemoglobin A1c   Lipid panel   Other Visit Diagnoses       High risk medication use       Relevant Orders   Magnesium         Meds ordered this encounter  Medications   ALPRAZolam  (XANAX ) 0.5 MG tablet    Sig: Take 0.5 mg by mouth TID as needed for anxiety.    Dispense:  90 tablet    Refill:  0    Supervising Provider:   ALPHONSA HAMILTON A [[id number]]    Assessment and Plan Assessment & Plan Primary hypertension Blood pressure  controlled at 136/84 mmHg with reduced losartan  dose due to increased physical activity and weight loss. - Continue losartan  25 mg oral daily. - Encourage continued  physical activity and healthy diet.  Hyperlipidemia on PCSK9 inhibitor therapy (Repatha ) with statin intolerance, history of cerebrovascular accident Repatha  effective in maintaining LDL below 70 mg/dL. Atorvastatin  causes adverse effects. - Discontinue atorvastatin . - Continue Repatha  therapy. - Check lipid panel today and again in six months.  Hypothyroidism on levothyroxine  with recent dose adjustment Levothyroxine  88 mcg every other day improved symptoms. Prefers and tolerates Synthroid . - Order TSH and free T4 tests to assess thyroid  function.  Gastroesophageal reflux disease (GERD) Reflux well-managed with pantoprazole  and Pepcid  for breakthrough symptoms. - Continue pantoprazole  40 mg oral daily. - Use Pepcid  as needed for breakthrough symptoms. - Occasional constipation managed by increasing water intake. Advised to consume high magnesium foods. - Increase dietary intake of high magnesium foods. - Consider Miralax  or Colace if needed.  Mild Anxiety, stable Anxiety well-managed with sparing use of Xanax  and non-pharmacological methods. - Continue current management with Xanax  as needed. - Encourage non-pharmacological methods such as physical activity.  General Health Maintenance Considering flu shot, due for tetanus booster, and advised to consider pneumococcal vaccine per new guidelines for those aged 33 and older. - Consider flu vaccination after Labor Day. - Get tetanus booster at a local pharmacy. - Consider pneumococcal vaccine (Prevnar 20).  Follow-Up - Schedule follow-up in six months. - Order fasting labs.    "

## 2024-08-15 ENCOUNTER — Encounter: Payer: Self-pay | Admitting: Nurse Practitioner

## 2024-08-15 DIAGNOSIS — F419 Anxiety disorder, unspecified: Secondary | ICD-10-CM | POA: Insufficient documentation

## 2024-08-15 MED ORDER — ALPRAZOLAM 0.5 MG PO TABS: ORAL_TABLET | ORAL | 0 refills | Status: AC

## 2024-08-23 ENCOUNTER — Ambulatory Visit: Payer: Self-pay | Admitting: Nurse Practitioner

## 2024-08-23 LAB — LIPID PANEL
Chol/HDL Ratio: 2.5 ratio (ref 0.0–4.4)
Cholesterol, Total: 156 mg/dL (ref 100–199)
HDL: 62 mg/dL (ref >39)
LDL Chol Calc (NIH): 70 mg/dL (ref 0–99)
Triglycerides: 142 mg/dL (ref 0–149)
VLDL Cholesterol Cal: 24 mg/dL (ref 5–40)

## 2024-08-23 LAB — TSH+FREE T4
Free T4: 1.23 ng/dL (ref 0.82–1.77)
TSH: 4.36 u[IU]/mL (ref 0.450–4.500)

## 2024-08-23 LAB — MICROALBUMIN / CREATININE URINE RATIO
Creatinine, Urine: 146.6 mg/dL
Microalb/Creat Ratio: 13 mg/g{creat} (ref 0–29)
Microalbumin, Urine: 18.4 ug/mL

## 2024-08-23 LAB — COMPREHENSIVE METABOLIC PANEL WITH GFR
ALT: 14 IU/L (ref 0–32)
AST: 22 IU/L (ref 0–40)
Albumin: 4.5 g/dL (ref 3.8–4.9)
Alkaline Phosphatase: 131 IU/L — ABNORMAL HIGH (ref 44–121)
BUN/Creatinine Ratio: 10 (ref 9–23)
BUN: 11 mg/dL (ref 6–24)
Bilirubin Total: 1.4 mg/dL — ABNORMAL HIGH (ref 0.0–1.2)
CO2: 24 mmol/L (ref 20–29)
Calcium: 9.6 mg/dL (ref 8.7–10.2)
Chloride: 101 mmol/L (ref 96–106)
Creatinine, Ser: 1.14 mg/dL — ABNORMAL HIGH (ref 0.57–1.00)
Globulin, Total: 2.6 g/dL (ref 1.5–4.5)
Glucose: 92 mg/dL (ref 70–99)
Potassium: 4 mmol/L (ref 3.5–5.2)
Sodium: 142 mmol/L (ref 134–144)
Total Protein: 7.1 g/dL (ref 6.0–8.5)
eGFR: 56 mL/min/1.73 — ABNORMAL LOW (ref >59)

## 2024-08-23 LAB — HEPATITIS C ANTIBODY: Hep C Virus Ab: NONREACTIVE

## 2024-08-23 LAB — HEMOGLOBIN A1C
Est. average glucose Bld gHb Est-mCnc: 111 mg/dL
Hgb A1c MFr Bld: 5.5 % (ref 4.8–5.6)

## 2024-08-23 LAB — MAGNESIUM: Magnesium: 2 mg/dL (ref 1.6–2.3)

## 2024-08-24 ENCOUNTER — Other Ambulatory Visit: Payer: Self-pay | Admitting: Nurse Practitioner

## 2024-08-24 DIAGNOSIS — E038 Other specified hypothyroidism: Secondary | ICD-10-CM

## 2024-08-24 MED ORDER — LEVOTHYROXINE SODIUM 75 MCG PO TABS: 75.0000 ug | ORAL_TABLET | Freq: Every day | ORAL | 0 refills | Status: DC

## 2024-09-05 ENCOUNTER — Other Ambulatory Visit: Payer: Self-pay | Admitting: Nurse Practitioner

## 2024-09-05 MED ORDER — LEVOTHYROXINE SODIUM 88 MCG PO TABS: 88.0000 ug | ORAL_TABLET | Freq: Every day | ORAL | 0 refills | Status: AC

## 2024-10-22 ENCOUNTER — Other Ambulatory Visit: Payer: Self-pay | Admitting: Family Medicine

## 2024-10-22 DIAGNOSIS — E785 Hyperlipidemia, unspecified: Secondary | ICD-10-CM

## 2024-11-05 ENCOUNTER — Ambulatory Visit: Admitting: Neurology

## 2024-11-05 ENCOUNTER — Encounter: Payer: Self-pay | Admitting: Neurology

## 2024-11-05 VITALS — BP 160/92 | HR 95 | Ht 65.0 in | Wt 216.4 lb

## 2024-11-05 DIAGNOSIS — I679 Cerebrovascular disease, unspecified: Secondary | ICD-10-CM | POA: Diagnosis not present

## 2024-11-05 DIAGNOSIS — Z8673 Personal history of transient ischemic attack (TIA), and cerebral infarction without residual deficits: Secondary | ICD-10-CM | POA: Diagnosis not present

## 2024-11-05 NOTE — Patient Instructions (Signed)
 I had a long d/w patient about her recent syncopal episodes, silent lacunar stroke and asymptomatic middle cerebral artery stenosis, risk for recurrent stroke/TIAs, personally independently reviewed imaging studies and stroke evaluation results and answered questions.Continue aspirin  81 mg daily  for secondary stroke prevention and maintain strict control of hypertension with blood pressure goal below 130/90, diabetes with hemoglobin A1c goal below 6.5% and lipids with LDL cholesterol goal below 70 mg/dL. I also advised the patient to eat a healthy diet with plenty of whole grains, cereals, fruits and vegetables, exercise regularly and maintain ideal body weight continue Repatha  injections for hyperlipidemia.  Continue conservative follow-up for her moderate MCA stenosis and if she has recurrent right hemispheric ischemic symptoms may consider more aggressive treatment in the future.  Followup in the future with me in 1 year or call earlier if necessary.

## 2024-11-05 NOTE — Progress Notes (Signed)
 [facility] [address] [place]. [place] [address] [phone]       OFFICE FOLLOW-UP VISIT NOTE  [name] Date of Birth:  March 27, 1966 Medical Record Number:  [id number]   Referring MD: Arlin Krebs  Reason for Referral: Syncope and stroke  HPI: Initial visit 02/15/2024 :[name] is a 58 year old Caucasian lady seen today for initial office consultation visit.  History is obtained from the patient and review of electronic medical records and I personally reviewed pertinent available imaging films in PACS.  Patient presented on 11/28/2023 for evaluation for disorder of brief loss of consciousness.  She states she was at her baseline in the morning and then went to work.  There is she started feeling unwell and felt nauseous and lightheaded.  When she stood up she noticed black spots in front of her vision and then subsequently lost consciousness for few seconds.  She returned quickly back to baseline.  She did not have any confusion, tongue bite, incontinence or sensation of hot and cold in her feet.  When EMS evaluated her vital signs were okay.  She quickly returned back to her baseline.  After evaluation in the ER a CT scan was obtained which showed no acute abnormality and CT angiogram however showed severe focal stenosis of the right M1 segment of the middle cerebral artery and there was a mild soft plaque at the right carotid bifurcation.  MRI scan of the brain was obtained which showed a punctate acute infarct in the periatrial white matter of the left lateral ventricle and confirmed loss of flow signal in the right M1.  Patient was felt to have a syncopal event and incidental lacunar infarct from small vessel disease.  Patient was referred to Dr. Dolphus who did an outpatient cerebral catheter angiogram on 12/29/2023 which showed however only 50% right M1 stenosis and medical management was recommended.  Echocardiogram showed ejection fraction 75%.  Left atrial  size was normal.  Patient was placed on aspirin  and Lipitor and she is tolerating both medications well without side effects.  Follow-up lipid profile done yesterday on 02/13/2024 showed LDL-cholesterol to be 91 mg percent.  Hemoglobin A1c was 5.3 on 11/28/2023.  Patient has had no further episodes of passing out or any other focal neurological symptoms.  She does have history of mild sleep apnea and stenosis but was advised to lose weight was not felt to be candidate for CPAP.  There is no family history of stroke or TIA.  She did wear a 30-day heart monitor which showed no evidence of any paroxysmal A-fib.  She has no new complaints today. Update 11/05/2024 : She returns for follow-up after last visit 8 months ago.  She states she is doing well.  She has had no stroke or TIA symptoms.  She remains on aspirin  she is tolerating well without bruising or bleeding.  She states her blood pressure at home is quite well-controlled and in the 120-130 systolic range however it is elevated in office at 160/92 and she blames this on whitecoat hypertension.  She was switched from Lipitor to Repatha  injections and is tolerating them well without side effects.  Last lipid profile in 08/22/2024 was optimal LDL at 70 mg percent and hemoglobin A1c 5.5.  She has been eating healthy exercising regularly and has lost about 40 pounds in the last 1 year.  She was diagnosed with mild obstructive sleep apnea a year ago but was not recommended to be on CPAP.  She  has no new complaints today.  She denies any new health problems. ROS:   14 system review of systems is positive for syncope, lightheadedness, nausea, passing out, snoring all other systems negative  PMH:  Past Medical History:  Diagnosis Date   GERD (gastroesophageal reflux disease)    Hypothyroidism    IBS (irritable bowel syndrome)    S/P colonoscopy 07/13/2010   [place]: no results available. Reportedly done because heme +    Social History:  Social History    Socioeconomic History   Marital status: Married    Spouse name: Not on file   Number of children: 2   Years of education: Not on file   Highest education level: Bachelor's degree (e.g., BA, AB, BS)  Occupational History   Occupation: Community Education Officer: [employer]    Comment: [place], [place]  Tobacco Use   Smoking status: Never   Smokeless tobacco: Never  Vaping Use   Vaping status: Never Used  Substance and Sexual Activity   Alcohol use: No    Alcohol/week: 0.0 standard drinks of alcohol   Drug use: No   Sexual activity: Yes    Partners: Male    Birth control/protection: None    Comment: spouse  Other Topics Concern   Not on file  Social History Narrative   Not on file   Social Drivers of Health   Financial Resource Strain: Low Risk  (12/20/2023)   Overall Financial Resource Strain (CARDIA)    Difficulty of Paying Living Expenses: Not hard at all  Food Insecurity: No Food Insecurity (12/20/2023)   Hunger Vital Sign    Worried About Running Out of Food in the Last Year: Never true    Ran Out of Food in the Last Year: Never true  Transportation Needs: No Transportation Needs (12/20/2023)   PRAPARE - Administrator, Civil Service (Medical): No    Lack of Transportation (Non-Medical): No  Physical Activity: Insufficiently Active (12/20/2023)   Exercise Vital Sign    Days of Exercise per Week: 4 days    Minutes of Exercise per Session: 20 min  Stress: Stress Concern Present (12/20/2023)   [employer] of Occupational Health - Occupational Stress Questionnaire    Feeling of Stress : To some extent  Social Connections: Unknown (12/20/2023)   Social Connection and Isolation Panel    Frequency of Communication with Friends and Family: Patient declined    Frequency of Social Gatherings with Friends and Family: Patient declined    Attends Religious Services: Patient declined    Database Administrator or Organizations: Patient declined    Attends Banker  Meetings: Not on file    Marital Status: Married  Intimate Partner Violence: Not At Risk (11/28/2023)   Humiliation, Afraid, Rape, and Kick questionnaire    Fear of Current or Ex-Partner: No    Emotionally Abused: No    Physically Abused: No    Sexually Abused: No    Medications:   Current Outpatient Medications on File Prior to Visit  Medication Sig Dispense Refill   acetaminophen  (TYLENOL ) 325 MG tablet Take 2 tablets (650 mg total) by mouth every 6 (six) hours as needed for mild pain (pain score 1-3), fever or headache (or Fever >/= 100.4).     ALPRAZolam  (XANAX ) 0.5 MG tablet Take 0.5 mg by mouth TID as needed for anxiety. 90 tablet 0   aspirin  EC 81 MG tablet Take 1 tablet (81 mg total) by mouth daily  with breakfast. take Aspirin  81 mg daily along with Plavix  75 mg daily for 21 days then after that STOP the Plavix   and continue ONLY Aspirin  81 mg daily indefinitely--for secondary stroke Prevention 100 tablet 3   Evolocumab  (REPATHA  SURECLICK) 140 MG/ML SOAJ ADMINISTER 1 ML UNDER THE SKIN EVERY 14 DAYS 4 mL 0   famotidine  (PEPCID ) 20 MG tablet Take 1 tablet (20 mg total) by mouth 2 (two) times daily. (Patient taking differently: Take 20 mg by mouth as needed for heartburn.) 30 tablet 0   levothyroxine  (SYNTHROID ) 88 MCG tablet Take 1 tablet (88 mcg total) by mouth daily before breakfast. 90 tablet 0   losartan  (COZAAR ) 50 MG tablet Take 1 tablet (50 mg total) by mouth daily. (Patient taking differently: Take 25 mg by mouth daily.) 90 tablet 3   pantoprazole  (PROTONIX ) 40 MG tablet TAKE 1 TABLET(40 MG) BY MOUTH DAILY 90 tablet 3   meclizine  (ANTIVERT ) 25 MG tablet Take 1 tablet (25 mg total) by mouth 3 (three) times daily as needed for dizziness. (Patient not taking: Reported on 11/05/2024) 30 tablet 0   meloxicam  (MOBIC ) 15 MG tablet Take 1 tablet (15 mg total) by mouth daily as needed for pain. (Patient not taking: Reported on 11/05/2024) 14 tablet 0   No current facility-administered  medications on file prior to visit.    Allergies:   Allergies  Allergen Reactions   Erythromycin Hives   Amoxil [Amoxicillin] Rash    Rash on palms of hands; peeling of hands     Physical Exam General: Mildly obese pleasant middle-age Caucasian lady, seated, in no evident distress Head: head normocephalic and atraumatic.   Neck: supple with no carotid or supraclavicular bruits Cardiovascular: regular rate and rhythm, no murmurs Musculoskeletal: no deformity Skin:  no rash/petichiae Vascular:  Normal pulses all extremities  Neurologic Exam Mental Status: Awake and fully alert. Oriented to place and time. Recent and remote memory intact. Attention span, concentration and fund of knowledge appropriate. Mood and affect appropriate.  Cranial Nerves: Fundoscopic exam not done. Pupils equal, briskly reactive to light. Extraocular movements full without nystagmus. Visual fields full to confrontation. Hearing intact. Facial sensation intact. Face, tongue, palate moves normally and symmetrically.  Motor: Normal bulk and tone. Normal strength in all tested extremity muscles. Sensory.: intact to touch , pinprick , position and vibratory sensation.  Coordination: Rapid alternating movements normal in all extremities. Finger-to-nose and heel-to-shin performed accurately bilaterally. Gait and Station: Arises from chair without difficulty. Stance is normal. Gait demonstrates normal stride length and balance . Able to heel, toe and tandem walk with mild difficulty.  Reflexes: 1+ and symmetric. Toes downgoing.      ASSESSMENT: 58 year old Caucasian lady with syncopal episode in December 2024 with MRI showing silent tiny lacunar periventricular subcortical infarct as well as asymptomatic moderate right MCA stenosis.  Vascular risk factors of hyper lipidemia mild obesity, intracranial stenosis and mild sleep apnea.  She is doing well and is stable from neurovascular standpoint     PLAN: I had a long  d/w patient about her recent syncopal episodes, silent lacunar stroke and asymptomatic middle cerebral artery stenosis, risk for recurrent stroke/TIAs, personally independently reviewed imaging studies and stroke evaluation results and answered questions.Continue aspirin  81 mg daily  for secondary stroke prevention and maintain strict control of hypertension with blood pressure goal below 130/90, diabetes with hemoglobin A1c goal below 6.5% and lipids with LDL cholesterol goal below 70 mg/dL. I also advised the patient to eat a healthy  diet with plenty of whole grains, cereals, fruits and vegetables, exercise regularly and maintain ideal body weight continue Repatha  injections for hyperlipidemia.  Continue conservative follow-up for her moderate MCA stenosis and if she has recurrent right hemispheric ischemic symptoms may consider more aggressive treatment in the future.  Followup in the future with me in 1 year or call earlier if necessary.     I personally spent a total of 35 minutes in the care of the patient today including getting/reviewing separately obtained history, performing a medically appropriate exam/evaluation, counseling and educating, placing orders, referring and communicating with other health care professionals, documenting clinical information in the EHR, independently interpreting results, and coordinating care.        Eather Popp, MD Note: This document was prepared with digital dictation and possible smart phrase technology. Any transcriptional errors that result from this process are unintentional.

## 2024-12-20 ENCOUNTER — Other Ambulatory Visit: Payer: Self-pay | Admitting: Family Medicine

## 2024-12-20 DIAGNOSIS — E785 Hyperlipidemia, unspecified: Secondary | ICD-10-CM

## 2025-01-17 ENCOUNTER — Encounter

## 2025-02-11 ENCOUNTER — Ambulatory Visit: Admitting: Nurse Practitioner

## 2025-02-25 ENCOUNTER — Ambulatory Visit: Admitting: Nurse Practitioner

## 2025-11-04 ENCOUNTER — Ambulatory Visit: Admitting: Neurology
